# Patient Record
Sex: Female | Born: 1975 | Hispanic: Yes | Marital: Single | Smoking: Never smoker
Health system: Southern US, Community
[De-identification: ages and names within clinical notes are randomized; demographics above are authoritative.]

## PROBLEM LIST (undated history)

## (undated) ENCOUNTER — Inpatient Hospital Stay (HOSPITAL_COMMUNITY): Payer: Self-pay

## (undated) DIAGNOSIS — Z789 Other specified health status: Secondary | ICD-10-CM

## (undated) DIAGNOSIS — T4145XA Adverse effect of unspecified anesthetic, initial encounter: Secondary | ICD-10-CM

## (undated) DIAGNOSIS — T8859XA Other complications of anesthesia, initial encounter: Secondary | ICD-10-CM

---

## 2015-09-19 ENCOUNTER — Encounter (HOSPITAL_BASED_OUTPATIENT_CLINIC_OR_DEPARTMENT_OTHER): Payer: Self-pay | Admitting: Emergency Medicine

## 2015-09-19 DIAGNOSIS — S61411A Laceration without foreign body of right hand, initial encounter: Secondary | ICD-10-CM | POA: Diagnosis not present

## 2015-09-19 DIAGNOSIS — Y9389 Activity, other specified: Secondary | ICD-10-CM | POA: Insufficient documentation

## 2015-09-19 DIAGNOSIS — Y990 Civilian activity done for income or pay: Secondary | ICD-10-CM | POA: Diagnosis not present

## 2015-09-19 DIAGNOSIS — Y9289 Other specified places as the place of occurrence of the external cause: Secondary | ICD-10-CM | POA: Insufficient documentation

## 2015-09-19 DIAGNOSIS — S6991XA Unspecified injury of right wrist, hand and finger(s), initial encounter: Secondary | ICD-10-CM | POA: Diagnosis present

## 2015-09-19 DIAGNOSIS — Z23 Encounter for immunization: Secondary | ICD-10-CM | POA: Diagnosis not present

## 2015-09-19 DIAGNOSIS — W311XXA Contact with metalworking machines, initial encounter: Secondary | ICD-10-CM | POA: Diagnosis not present

## 2015-09-19 NOTE — ED Notes (Signed)
Pt in c/o hand laceration from drill at work, bleeding controlled, NAD.

## 2015-09-20 ENCOUNTER — Encounter (HOSPITAL_BASED_OUTPATIENT_CLINIC_OR_DEPARTMENT_OTHER): Payer: Self-pay | Admitting: Emergency Medicine

## 2015-09-20 ENCOUNTER — Emergency Department (HOSPITAL_BASED_OUTPATIENT_CLINIC_OR_DEPARTMENT_OTHER)
Admission: EM | Admit: 2015-09-20 | Discharge: 2015-09-20 | Disposition: A | Discharge status: Home / Self Care | Payer: Worker's Compensation | Attending: Emergency Medicine | Admitting: Emergency Medicine

## 2015-09-20 DIAGNOSIS — IMO0002 Reserved for concepts with insufficient information to code with codable children: Secondary | ICD-10-CM

## 2015-09-20 MED ORDER — NAPROXEN 250 MG PO TABS
500.0000 mg | ORAL_TABLET | Freq: Once | ORAL | Status: AC
  Administered 2015-09-20: 500 mg via ORAL
  Filled 2015-09-20: qty 2

## 2015-09-20 MED ORDER — TETANUS-DIPHTH-ACELL PERTUSSIS 5-2.5-18.5 LF-MCG/0.5 IM SUSP
0.5000 mL | Freq: Once | INTRAMUSCULAR | Status: AC
  Administered 2015-09-20: 0.5 mL via INTRAMUSCULAR
  Filled 2015-09-20: qty 0.5

## 2015-09-20 NOTE — ED Provider Notes (Signed)
CSN: [id number]     Arrival date & time 09/19/15  2236 History   First MD Initiated Contact with Patient 09/20/15 0055     Chief Complaint  Patient presents with  . Extremity Laceration     (Consider location/radiation/quality/duration/timing/severity/associated sxs/prior Treatment) Patient is a 40 y.o. female presenting with skin laceration. The history is provided by the patient and a friend.  Laceration Location:  Hand Hand laceration location:  Dorsum of R hand Depth:  Through dermis Quality: straight   Bleeding: controlled   Injury mechanism: drill. Pain details:    Quality:  Aching   Severity:  No pain   Progression:  Unchanged Foreign body present:  No foreign bodies Relieved by:  Nothing Worsened by:  Nothing tried Ineffective treatments:  None tried Tetanus status:  Out of date   History reviewed. No pertinent past medical history. Past Surgical History  Procedure Laterality Date  . Cesarean section     History reviewed. No pertinent family history. Social History  Substance Use Topics  . Smoking status: Never Smoker   . Smokeless tobacco: None  . Alcohol Use: No   OB History    No data available     Review of Systems  All other systems reviewed and are negative.     Allergies  Review of patient's allergies indicates no known allergies.  Home Medications   Prior to Admission medications   Not on File   BP 110/71 mmHg  Pulse 73  Temp(Src) 98.2 F (36.8 C) (Oral)  Resp 16  Ht 5\' 1"  (1.549 m)  Wt 150 lb (68.04 kg)  BMI 28.36 kg/m2  SpO2 99% Physical Exam  Constitutional: She is oriented to person, place, and time. She appears well-developed and well-nourished.  HENT:  Head: Normocephalic and atraumatic.  Mouth/Throat: Oropharynx is clear and moist.  Eyes: Pupils are equal, round, and reactive to light.  Cardiovascular: Normal rate and regular rhythm.   Pulmonary/Chest: Effort normal and breath sounds normal. She has no wheezes. She has  no rales.  Abdominal: Soft. Bowel sounds are normal. There is no tenderness. There is no rebound and no guarding.  Musculoskeletal: Normal range of motion.  Neurological: She is alert and oriented to person, place, and time.  Skin: Skin is warm and dry.  Psychiatric: She has a normal mood and affect.    ED Course  .Marland Kitchen.Laceration Repair Date/Time: 09/20/2015 2:01 AM Performed by: Cy BlamerPALUMBO, Shilpa Bushee Authorized by: Cy BlamerPALUMBO, Adis Sturgill Consent: Verbal consent obtained. Patient identity confirmed: arm band Body area: upper extremity Location details: right hand Laceration length: 1 cm Foreign bodies: no foreign bodies Tendon involvement: none Nerve involvement: none Vascular damage: no Patient sedated: no Irrigation solution: saline Amount of cleaning: extensive Debridement: none Degree of undermining: none Skin closure: glue Technique: simple Approximation: loose Approximation difficulty: simple Dressing: 4x4 sterile gauze Patient tolerance: Patient tolerated the procedure well with no immediate complications   (including critical care time) Labs Review Labs Reviewed - No data to display  Imaging Review No results found. I have personally reviewed and evaluated these images and lab results as part of my medical decision-making.   EKG Interpretation None      MDM   Final diagnoses:  None    Follow up with your occ med panel as directed    Darrielle Pflieger, MD 09/20/15 [id number]

## 2017-03-14 ENCOUNTER — Inpatient Hospital Stay (HOSPITAL_COMMUNITY)
Admission: AD | Admit: 2017-03-14 | Discharge: 2017-03-14 | Disposition: A | Discharge status: Home / Self Care | Payer: Self-pay | Source: Ambulatory Visit | Attending: Family Medicine | Admitting: Family Medicine

## 2017-03-14 ENCOUNTER — Inpatient Hospital Stay (HOSPITAL_COMMUNITY): Payer: Self-pay

## 2017-03-14 ENCOUNTER — Encounter (HOSPITAL_COMMUNITY): Payer: Self-pay | Admitting: *Deleted

## 2017-03-14 DIAGNOSIS — B3731 Acute candidiasis of vulva and vagina: Secondary | ICD-10-CM

## 2017-03-14 DIAGNOSIS — Z3A08 8 weeks gestation of pregnancy: Secondary | ICD-10-CM | POA: Insufficient documentation

## 2017-03-14 DIAGNOSIS — O26899 Other specified pregnancy related conditions, unspecified trimester: Secondary | ICD-10-CM

## 2017-03-14 DIAGNOSIS — O26892 Other specified pregnancy related conditions, second trimester: Secondary | ICD-10-CM | POA: Insufficient documentation

## 2017-03-14 DIAGNOSIS — O4691 Antepartum hemorrhage, unspecified, first trimester: Secondary | ICD-10-CM | POA: Insufficient documentation

## 2017-03-14 DIAGNOSIS — O98812 Other maternal infectious and parasitic diseases complicating pregnancy, second trimester: Secondary | ICD-10-CM | POA: Insufficient documentation

## 2017-03-14 DIAGNOSIS — R109 Unspecified abdominal pain: Secondary | ICD-10-CM | POA: Insufficient documentation

## 2017-03-14 DIAGNOSIS — O209 Hemorrhage in early pregnancy, unspecified: Secondary | ICD-10-CM

## 2017-03-14 DIAGNOSIS — Z3491 Encounter for supervision of normal pregnancy, unspecified, first trimester: Secondary | ICD-10-CM

## 2017-03-14 DIAGNOSIS — B373 Candidiasis of vulva and vagina: Secondary | ICD-10-CM | POA: Insufficient documentation

## 2017-03-14 HISTORY — DX: Other specified health status: Z78.9

## 2017-03-14 LAB — URINALYSIS, ROUTINE W REFLEX MICROSCOPIC
BILIRUBIN URINE: NEGATIVE
Glucose, UA: NEGATIVE mg/dL
KETONES UR: NEGATIVE mg/dL
LEUKOCYTES UA: NEGATIVE
NITRITE: NEGATIVE
PH: 5 (ref 5.0–8.0)
Protein, ur: NEGATIVE mg/dL
SPECIFIC GRAVITY, URINE: 1.026 (ref 1.005–1.030)

## 2017-03-14 LAB — POCT PREGNANCY, URINE: Preg Test, Ur: POSITIVE — AB

## 2017-03-14 LAB — WET PREP, GENITAL
Clue Cells Wet Prep HPF POC: NONE SEEN
SPERM: NONE SEEN
TRICH WET PREP: NONE SEEN

## 2017-03-14 LAB — CBC
HEMATOCRIT: 37.7 % (ref 36.0–46.0)
Hemoglobin: 12.8 g/dL (ref 12.0–15.0)
MCH: 31.1 pg (ref 26.0–34.0)
MCHC: 34 g/dL (ref 30.0–36.0)
MCV: 91.5 fL (ref 78.0–100.0)
PLATELETS: 296 10*3/uL (ref 150–400)
RBC: 4.12 MIL/uL (ref 3.87–5.11)
RDW: 13.3 % (ref 11.5–15.5)
WBC: 10.2 10*3/uL (ref 4.0–10.5)

## 2017-03-14 LAB — ABO/RH: ABO/RH(D): A POS

## 2017-03-14 LAB — HCG, QUANTITATIVE, PREGNANCY: hCG, Beta Chain, Quant, S: 126914 m[IU]/mL — ABNORMAL HIGH (ref <5)

## 2017-03-14 MED ORDER — TERCONAZOLE 0.8 % VA CREA: 1.0000 | TOPICAL_CREAM | Freq: Every day | VAGINAL | 0 refills | Status: DC

## 2017-03-14 NOTE — Discharge Instructions (Signed)
Primer trimestre de embarazo °(First Trimester of Pregnancy) °El primer trimestre de embarazo se extiende desde la semana 1 hasta el final de la semana 12 (mes 1 al mes 3). Durante este tiempo, el bebé comenzará a desarrollarse dentro suyo. Entre la semana 6 y la 8, se forman los ojos y el rostro, y los latidos del corazón pueden verse en la ecografía. Al final de las 12 semanas, todos los órganos del bebé están formados. La atención prenatal es toda la asistencia médica que usted recibe antes del nacimiento del bebé. Asegúrese de recibir una buena atención prenatal y de seguir todas las indicaciones del médico. °CUIDADOS EN EL HOGAR °Medicamentos: °· Tome los medicamentos solamente como se lo haya indicado el médico. Algunos medicamentos se pueden tomar durante el embarazo y otros no. °· Tome las vitaminas prenatales como se lo haya indicado el médico. °· Tome el medicamento que la ayuda a defecar (laxante suave) según sea necesario, si el médico lo autoriza. °Dieta °· Ingiera alimentos saludables de manera regular. °· El médico le indicará la cantidad de peso que puede aumentar. °· No coma carne cruda ni quesos sin cocinar. °· Si tiene malestar estomacal (náuseas) o vomita: °? Ingiera 4 o 5 comidas pequeñas por día en lugar de 3 abundantes. °? Intente comer algunas galletitas saladas. °? Beba líquidos entre las comidas, en lugar de hacerlo durante estas. °· Si tiene dificultad para defecar (estreñimiento): °? Consuma alimentos con alto contenido de fibra, como verduras y frutas frescos, y cereales integrales. °? Beba suficiente líquido para mantener el pis (orina) claro o de color amarillo pálido. °Actividad y ejercicios °· Haga ejercicios solamente como se lo haya indicado el médico. Deje de hacer ejercicios si tiene cólicos o dolor en la parte baja del vientre (abdomen) o en la cintura. °· Intente no estar de pie durante mucho tiempo. Mueva las piernas con frecuencia si debe estar de pie en un lugar durante  mucho tiempo. °· Evite levantar pesos excesivos. °· Use zapatos con tacones bajos. Mantenga una buena postura al sentarse y pararse. °· Puede tener relaciones sexuales, a menos que el médico le indique lo contrario. °Alivio del dolor o las molestias °· Use un sostén que le brinde buen soporte si le duelen las mamas. °· Dese baños con agua tibia (baños de asiento) para aliviar el dolor o las molestias a causa de las hemorroides. Use crema antihemorroidal si el médico se lo permite. °· Descanse con las piernas elevadas si tiene calambres o dolor de cintura. °· Use medias de descanso si tiene las venas de las piernas hinchadas y abultadas (venas varicosas). Eleve los pies durante 15 minutos, 3 o 4 veces por día. Limite la cantidad de sal en su dieta. °Cuidados prenatales °· Programe las visitas prenatales para la semana 12 de embarazo. °· Escriba sus preguntas. Llévelas cuando concurra a las visitas prenatales. °· Concurra a todas las visitas prenatales como se lo haya indicado el médico. °Seguridad °· Colóquese el cinturón de seguridad cuando conduzca. °· Haga una lista con los números de teléfono en caso de emergencia, en la cual deben incluirse los números de los familiares, los amigos, el hospital y los departamentos de policía y de bomberos. °Consejos generales °· Pídale al médico que la derive a clases prenatales en su localidad. Debe comenzar a tomar las clases antes de entrar en el mes 6 de embarazo. °· Pida ayuda si necesita asesoramiento o asistencia con la alimentación. El médico puede aconsejarla o indicarle dónde recurrir para recibir ayuda. °· No se dé baños de inmersión en agua caliente, baños   turcos ni saunas. °· No se haga duchas vaginales ni use tampones o toallas higiénicas perfumadas. °· No mantenga las piernas cruzadas durante mucho tiempo. °· Evite el contacto con las bandejas sanitarias de los gatos y la tierra que estos animales usan. °· No fume, no consuma hierbas ni beba alcohol. No tome  fármacos que el médico no haya autorizado. °· No consuma ningún producto que contenga tabaco, lo que incluye cigarrillos, tabaco de mascar o cigarrillos electrónicos. Si necesita ayuda para dejar de fumar, consulte al médico. Puede recibir asesoramiento u otro tipo de apoyo para dejar de fumar. °· Visite al dentista. En su casa, lávese los dientes con un cepillo dental suave. Pásese el hilo dental con suavidad. °SOLICITE AYUDA SI: °· Tiene mareos. °· Tiene cólicos leves o siente presión en la parte baja del vientre. °· Siente un dolor persistente en la zona del vientre. °· Sigue teniendo malestar estomacal, vomita o las heces son líquidas (diarrea). °· Observa una secreción, con mal olor que proviene de la vagina. °· Siente dolor al orinar. °· Tiene el rostro, las manos, las piernas o los tobillos más hinchados (inflamados). ° °SOLICITE AYUDA DE INMEDIATO SI: °· Tiene fiebre. °· Tiene una pérdida de líquido por la vagina. °· Tiene sangrado o pequeñas pérdidas vaginales. °· Tiene cólicos o dolor muy intensos en el vientre. °· Sube o baja de peso rápidamente. °· Vomita sangre. Puede ser similar a la borra del café °· Está en contacto con personas que tienen rubéola, la quinta enfermedad o varicela. °· Siente un dolor de cabeza muy intenso. °· Le falta el aire. °· Sufre cualquier tipo de traumatismo, por ejemplo, debido a una caída o un accidente automovilístico. ° °Esta información no tiene como fin reemplazar el consejo del médico. Asegúrese de hacerle al médico cualquier pregunta que tenga. °Document Released: 09/16/2008 Document Revised: 07/11/2014 Document Reviewed: 04/30/2013 °Elsevier Interactive Patient Education © 2017 Elsevier Inc[employer] ° °

## 2017-03-14 NOTE — MAU Provider Note (Signed)
History     CSN: [id number]  Arrival date and time: 03/14/17 1653  First Provider Initiated Contact with Patient 03/14/17 1927      Chief Complaint  Patient presents with  . Vaginal Bleeding  . Abdominal Pain  . Possible Pregnancy   HPI Carla Bernard is a 41 y.o. G5P4004 at unknown gestation who presents with abdominal pain & vaginal bleeding. Patient has irregular periods & doesn't know last menses. Had positive pregnancy test over the weekend. Reports intermittent lower abdominal cramping. Rates pain 7/10. Has not treated. Noted pink spotting on toilet paper yesterday x 1 episode. Denies n/v/d, dysuria, vaginal discharge, or irritation.   OB History    Gravida Para Term Preterm AB Living   SAB TAB Ectopic Multiple Live Births                  Past Medical History:  Diagnosis Date  . Medical history non-contributory     Past Surgical History:  Procedure Laterality Date  . CESAREAN SECTION     C/S x 4    History reviewed. No pertinent family history.  Social History  Substance Use Topics  . Smoking status: Never Smoker  . Smokeless tobacco: Never Used  . Alcohol use No    Allergies: No Known Allergies  No prescriptions prior to admission.    Review of Systems  Constitutional: Negative.   Gastrointestinal: Positive for abdominal pain. Negative for constipation, diarrhea, nausea and vomiting.  Genitourinary: Positive for vaginal bleeding. Negative for dysuria and vaginal discharge.   Physical Exam   Blood pressure 107/62, pulse 66, temperature 98.3 F (36.8 C), temperature source Oral, resp. rate 16, weight 153 lb 12 oz (69.7 kg), SpO2 100 %.  Physical Exam  Nursing note and vitals reviewed. Constitutional: She is oriented to person, place, and time. She appears well-developed and well-nourished. No distress.  HENT:  Head: Normocephalic and atraumatic.  Eyes: Conjunctivae are normal. Right eye exhibits no discharge. Left eye exhibits no  discharge. No scleral icterus.  Neck: Normal range of motion.  Cardiovascular: Normal rate, regular rhythm and normal heart sounds.   No murmur heard. Respiratory: Effort normal and breath sounds normal. No respiratory distress. She has no wheezes.  GI: Soft. Bowel sounds are normal. She exhibits no distension. There is no tenderness. There is no rebound and no guarding.  Genitourinary: Uterus is enlarged. Cervix exhibits no motion tenderness and no friability. No bleeding in the vagina. Vaginal discharge (small amount of thick white discharge) found.  Genitourinary Comments: Cervix closed  Neurological: She is alert and oriented to person, place, and time.  Skin: Skin is warm and dry. She is not diaphoretic.  Psychiatric: She has a normal mood and affect. Her behavior is normal. Judgment and thought content normal.    MAU Course  Procedures Results for orders placed or performed during the hospital encounter of 03/14/17 (from the past 48 hour(s))  Urinalysis, Routine w reflex microscopic     Status: Abnormal   Collection Time: 03/14/17  6:15 PM  Result Value Ref Range   Color, Urine YELLOW YELLOW   APPearance HAZY (A) CLEAR   Specific Gravity, Urine 1.026 1.005 - 1.030   pH 5.0 5.0 - 8.0   Glucose, UA NEGATIVE NEGATIVE mg/dL   Hgb urine dipstick MODERATE (A) NEGATIVE   Bilirubin Urine NEGATIVE NEGATIVE   Ketones, ur NEGATIVE NEGATIVE mg/dL   Protein, ur NEGATIVE NEGATIVE mg/dL  Nitrite NEGATIVE NEGATIVE   Leukocytes, UA NEGATIVE NEGATIVE   RBC / HPF 0-5 0 - 5 RBC/hpf   WBC, UA 0-5 0 - 5 WBC/hpf   Bacteria, UA RARE (A) NONE SEEN   Squamous Epithelial / LPF 0-5 (A) NONE SEEN   Mucus PRESENT   Pregnancy, urine POC     Status: Abnormal   Collection Time: 03/14/17  6:53 PM  Result Value Ref Range   Preg Test, Ur POSITIVE (A) NEGATIVE    Comment:        THE SENSITIVITY OF THIS METHODOLOGY IS >24 mIU/mL   CBC     Status: None   Collection Time: 03/14/17  7:32 PM  Result Value  Ref Range   WBC 10.2 4.0 - 10.5 K/uL   RBC 4.12 3.87 - 5.11 MIL/uL   Hemoglobin 12.8 12.0 - 15.0 g/dL   HCT 24.437.7 01.036.0 - 27.246.0 %   MCV 91.5 78.0 - 100.0 fL   MCH 31.1 26.0 - 34.0 pg   MCHC 34.0 30.0 - 36.0 g/dL   RDW 53.613.3 64.411.5 - 03.415.5 %   Platelets 296 150 - 400 K/uL  ABO/Rh     Status: None   Collection Time: 03/14/17  7:32 PM  Result Value Ref Range   ABO/RH(D) A POS   hCG, quantitative, pregnancy     Status: Abnormal   Collection Time: 03/14/17  7:32 PM  Result Value Ref Range   hCG, Beta Chain, Quant, S 126,914 (H) <5 mIU/mL    Comment:          GEST. AGE      CONC.  (mIU/mL)   <=1 WEEK        5 - 50     2 WEEKS       50 - 500     3 WEEKS       100 - 10,000     4 WEEKS     1,000 - 30,000     5 WEEKS     3,500 - 115,000   6-8 WEEKS     12,000 - 270,000    12 WEEKS     15,000 - 220,000        FEMALE AND NON-PREGNANT FEMALE:     LESS THAN 5 mIU/mL   Wet prep, genital     Status: Abnormal   Collection Time: 03/14/17  7:40 PM  Result Value Ref Range   Yeast Wet Prep HPF POC PRESENT (A) NONE SEEN   Trich, Wet Prep NONE SEEN NONE SEEN   Clue Cells Wet Prep HPF POC NONE SEEN NONE SEEN   WBC, Wet Prep HPF POC MANY (A) NONE SEEN    Comment: MANY BACTERIA SEEN   Sperm NONE SEEN    [place] Ob Comp Less 14 Wks  Result Date: 03/14/2017 CLINICAL DATA:  Initial evaluation for vaginal bleeding in first trimester pregnancy. EXAM: OBSTETRIC <14 WK US AND TRANSVAGINAL OB US TECHNIQUE: Both transabdominal and transvaginal ultrasound examinations were performed for complete evaluation of the gestation as well as the maternal uterus, adnexal regions, and pelvic cul-de-sac. Transvaginal technique was performed to assess early pregnancy. COMPARISON:  None. FINDINGS: Intrauterine gestational sac: Single Yolk sac:  Present Embryo:  Present Cardiac Activity: Present Heart Rate: 171  bpm CRL:  19.6  mm   8 w   3 d                  US EDC: 10/21/2017 Subchorionic hemorrhage: Small subchorionic hemorrhage  without associated mass effect. Maternal uterus/adnexae: Small corpus luteal cyst noted within the right ovary. Right ovary otherwise unremarkable. Left ovary normal. No adnexal mass. No free fluid within the pelvis. IMPRESSION: 1. Single viable IUP as above. Estimated gestational age 589 weeks and 3 days by sonography. 2. Small subchorionic hemorrhage without associated mass effect. 3. No other acute maternal uterine or adnexal abnormality identified. Electronically Signed   By: Rise Mu M.D.   On: 03/14/2017 20:53   US Ob Transvaginal  Result Date: 03/14/2017 CLINICAL DATA:  Initial evaluation for vaginal bleeding in first trimester pregnancy. EXAM: OBSTETRIC <14 WK [place] AND TRANSVAGINAL OB US TECHNIQUE: Both transabdominal and transvaginal ultrasound examinations were performed for complete evaluation of the gestation as well as the maternal uterus, adnexal regions, and pelvic cul-de-sac. Transvaginal technique was performed to assess early pregnancy. COMPARISON:  None. FINDINGS: Intrauterine gestational sac: Single Yolk sac:  Present Embryo:  Present Cardiac Activity: Present Heart Rate: 171  bpm CRL:  19.6  mm   8 w   3 d                  [place] EDC: 10/21/2017 Subchorionic hemorrhage: Small subchorionic hemorrhage without associated mass effect. Maternal uterus/adnexae: Small corpus luteal cyst noted within the right ovary. Right ovary otherwise unremarkable. Left ovary normal. No adnexal mass. No free fluid within the pelvis. IMPRESSION: 1. Single viable IUP as above. Estimated gestational age 589 weeks and 3 days by sonography. 2. Small subchorionic hemorrhage without associated mass effect. 3. No other acute maternal uterine or adnexal abnormality identified. Electronically Signed   By: Rise Mu M.D.   On: 03/14/2017 20:53    MDM +UPT UA, wet prep, GC/chlamydia, CBC, ABO/Rh, quant hCG, HIV, and [place] today to rule out ectopic pregnancy A positive Ultrasound shows SIUP measuring [redacted]w[redacted]d  with cardiac activity Will tx for yeast, + on wet prep   Assessment and Plan  A: 1. Normal IUP (intrauterine pregnancy) on prenatal ultrasound, first trimester   2. Vaginal bleeding in pregnancy, first trimester   3. Abdominal pain affecting pregnancy   4. Vaginal yeast infection    P: Discharge home Rx terazol Discussed reasons to return to MAU Start prenatal care GC/CT pending   Judeth Horn 03/14/2017, 7:27 PM

## 2017-03-14 NOTE — MAU Note (Signed)
+  HPT x2 last wk. Having pain in lower abd.  Bled yesterday and today.

## 2017-03-15 LAB — HIV ANTIBODY (ROUTINE TESTING W REFLEX): HIV Screen 4th Generation wRfx: NONREACTIVE

## 2017-03-15 LAB — GC/CHLAMYDIA PROBE AMP (~~LOC~~) NOT AT ARMC
Chlamydia: NEGATIVE
Neisseria Gonorrhea: NEGATIVE

## 2017-04-17 LAB — OB RESULTS CONSOLE HEPATITIS B SURFACE ANTIGEN: HEP B S AG: NEGATIVE

## 2017-04-17 LAB — OB RESULTS CONSOLE HIV ANTIBODY (ROUTINE TESTING): HIV: NONREACTIVE

## 2017-04-17 LAB — OB RESULTS CONSOLE ABO/RH: RH Type: POSITIVE

## 2017-04-17 LAB — OB RESULTS CONSOLE ANTIBODY SCREEN: Antibody Screen: NEGATIVE

## 2017-04-17 LAB — OB RESULTS CONSOLE GC/CHLAMYDIA
CHLAMYDIA, DNA PROBE: NEGATIVE
GC PROBE AMP, GENITAL: NEGATIVE

## 2017-04-17 LAB — OB RESULTS CONSOLE RPR: RPR: NONREACTIVE

## 2017-04-17 LAB — OB RESULTS CONSOLE RUBELLA ANTIBODY, IGM: Rubella: IMMUNE

## 2017-08-08 ENCOUNTER — Encounter (HOSPITAL_COMMUNITY): Payer: Self-pay

## 2017-08-17 ENCOUNTER — Other Ambulatory Visit: Payer: Self-pay | Admitting: Obstetrics & Gynecology

## 2017-09-27 LAB — OB RESULTS CONSOLE GBS: GBS: NEGATIVE

## 2017-10-04 ENCOUNTER — Encounter (HOSPITAL_COMMUNITY): Payer: Self-pay

## 2017-10-04 NOTE — Pre-Procedure Instructions (Signed)
[id number] interpreter number  

## 2017-10-12 NOTE — Patient Instructions (Addendum)
Instrucciones Para Antes de la Ciruga   Su ciruga est programada para 10/14/2017  (your procedure is scheduled on) Entre por la entrada principal del [facility]  a las [address] de la [place] -(enter through the main entrance at [facility] at  AM    [employer] el telfono, [place] el [phone] para informarnos de su llegada. (pick up phone, dial [phone] on arrival)     Por favor llame al [phone] si tiene algn problema en la maana de la ciruga (please call this number if you have any problems the morning of surgery.)                  Recuerde: (Remember)  No coma alimentos. (Do not eat food (After Midnight) Desps de medianoche)    No tome lquidos claros. (Do not drink clear liquids (After Midnight) Desps de medianoche)    No use joyas, maquillaje de ojos, lpiz labial, crema para el cuerpo o esmalte de uas oscuro. (Do not wear jewelry, eye makeup, lipstick, body lotion, or dark fingernail polish). Puede usar desodorante (you may wear deodorant)    No se afeite 48 horas de su ciruga. (Do not shave 48 hours before your surgery)    No traiga objetos de valor al hospital.  [place] no se hace responsable de ninguna pertenencia, ni objetos de valor que haya trado al hospital. (Do not bring valuable to the hospital.  [place] is not responsible for any belongings or valuables brought to the hospital)   [facility] estas medicinas en la maana de la ciruga con un SORBITO de agua nada (take these meds the morning of surgery with a SIP of water)     Durante la ciruga no se pueden usar lentes de contacto, dentaduras o puentes. (Contacts, dentures or bridgework cannot be worn in surgery).   Si va a ser ingresado despus de la ciruga, deje la [employer] Corporationmaleta en el carro hasta que se le haya asignado una habitacin. (If you are to be admitted after surgery, leave suitcase in car until your room has been assigned.)   A los pacientes que se les d de alta el  mismo da no se les permitir manejar a casa.  (Patients discharged on the day of surgery will not be allowed to drive home)    [place] y [place] de telfono del Programmer, multimediaconductor na. (Name and telephone number of your driver)   Instrucciones especiales N/A (Special Instructions)   Por favor, lea las hojas informativas que le entregaron. (Please read over the following fact sheets that you were given) Surgical Site Infection Prevention

## 2017-10-13 ENCOUNTER — Encounter (HOSPITAL_COMMUNITY)
Admission: RE | Admit: 2017-10-13 | Discharge: 2017-10-13 | Disposition: A | Discharge status: Home / Self Care | Payer: Medicaid Other | Source: Ambulatory Visit | Attending: Obstetrics & Gynecology | Admitting: Obstetrics & Gynecology

## 2017-10-13 HISTORY — DX: Adverse effect of unspecified anesthetic, initial encounter: T41.45XA

## 2017-10-13 HISTORY — DX: Other complications of anesthesia, initial encounter: T88.59XA

## 2017-10-13 LAB — CBC
HCT: 38.7 % (ref 36.0–46.0)
HEMOGLOBIN: 13 g/dL (ref 12.0–15.0)
MCH: 30 pg (ref 26.0–34.0)
MCHC: 33.6 g/dL (ref 30.0–36.0)
MCV: 89.4 fL (ref 78.0–100.0)
PLATELETS: 257 10*3/uL (ref 150–400)
RBC: 4.33 MIL/uL (ref 3.87–5.11)
RDW: 13.8 % (ref 11.5–15.5)
WBC: 9.6 10*3/uL (ref 4.0–10.5)

## 2017-10-13 LAB — TYPE AND SCREEN
ABO/RH(D): A POS
ANTIBODY SCREEN: NEGATIVE

## 2017-10-13 LAB — ABO/RH: ABO/RH(D): A POS

## 2017-10-14 ENCOUNTER — Other Ambulatory Visit: Payer: Self-pay

## 2017-10-14 ENCOUNTER — Inpatient Hospital Stay (HOSPITAL_COMMUNITY): Payer: Medicaid Other | Admitting: Anesthesiology

## 2017-10-14 ENCOUNTER — Encounter (HOSPITAL_COMMUNITY)
Admission: RE | Disposition: A | Discharge status: Home / Self Care | Payer: Self-pay | Source: Ambulatory Visit | Attending: Obstetrics & Gynecology

## 2017-10-14 ENCOUNTER — Inpatient Hospital Stay (HOSPITAL_COMMUNITY)
Admission: RE | Admit: 2017-10-14 | Discharge: 2017-10-16 | DRG: 785 | Disposition: A | Discharge status: Home / Self Care | Payer: Medicaid Other | Source: Ambulatory Visit | Attending: Obstetrics & Gynecology | Admitting: Obstetrics & Gynecology

## 2017-10-14 ENCOUNTER — Encounter (HOSPITAL_COMMUNITY): Payer: Self-pay | Admitting: *Deleted

## 2017-10-14 DIAGNOSIS — Z98891 History of uterine scar from previous surgery: Secondary | ICD-10-CM

## 2017-10-14 DIAGNOSIS — Z3A39 39 weeks gestation of pregnancy: Secondary | ICD-10-CM | POA: Diagnosis not present

## 2017-10-14 DIAGNOSIS — Z302 Encounter for sterilization: Secondary | ICD-10-CM

## 2017-10-14 DIAGNOSIS — O34211 Maternal care for low transverse scar from previous cesarean delivery: Secondary | ICD-10-CM | POA: Diagnosis present

## 2017-10-14 DIAGNOSIS — O34219 Maternal care for unspecified type scar from previous cesarean delivery: Secondary | ICD-10-CM | POA: Diagnosis present

## 2017-10-14 LAB — RPR: RPR: NONREACTIVE

## 2017-10-14 SURGERY — Surgical Case
Anesthesia: Spinal | Site: Abdomen | Wound class: Clean Contaminated

## 2017-10-14 MED ORDER — METOCLOPRAMIDE HCL 5 MG/ML IJ SOLN
INTRAMUSCULAR | Status: DC | PRN
  Administered 2017-10-14 (×2): 5 mg via INTRAVENOUS

## 2017-10-14 MED ORDER — DIPHENHYDRAMINE HCL 25 MG PO CAPS: 25.0000 mg | ORAL_CAPSULE | Freq: Four times a day (QID) | ORAL | Status: DC | PRN

## 2017-10-14 MED ORDER — DIPHENHYDRAMINE HCL 50 MG/ML IJ SOLN
INTRAMUSCULAR | Status: AC
  Filled 2017-10-14: qty 1

## 2017-10-14 MED ORDER — PHENYLEPHRINE 40 MCG/ML (10ML) SYRINGE FOR IV PUSH (FOR BLOOD PRESSURE SUPPORT)
PREFILLED_SYRINGE | INTRAVENOUS | Status: AC
  Filled 2017-10-14: qty 10

## 2017-10-14 MED ORDER — PRENATAL MULTIVITAMIN CH
1.0000 | ORAL_TABLET | Freq: Every day | ORAL | Status: DC
  Administered 2017-10-15 – 2017-10-16 (×2): 1 via ORAL
  Filled 2017-10-14 (×2): qty 1

## 2017-10-14 MED ORDER — LACTATED RINGERS IV SOLN
INTRAVENOUS | Status: DC | PRN
  Administered 2017-10-14: 12:00:00 via INTRAVENOUS

## 2017-10-14 MED ORDER — ACETAMINOPHEN 325 MG PO TABS
650.0000 mg | ORAL_TABLET | ORAL | Status: DC | PRN
  Administered 2017-10-14 – 2017-10-16 (×4): 650 mg via ORAL
  Filled 2017-10-14 (×5): qty 2

## 2017-10-14 MED ORDER — CEFAZOLIN SODIUM-DEXTROSE 2-4 GM/100ML-% IV SOLN
INTRAVENOUS | Status: AC
  Filled 2017-10-14: qty 100

## 2017-10-14 MED ORDER — ACETAMINOPHEN 160 MG/5ML PO SOLN: 325.0000 mg | ORAL | Status: DC | PRN

## 2017-10-14 MED ORDER — MORPHINE SULFATE (PF) 0.5 MG/ML IJ SOLN
INTRAMUSCULAR | Status: DC | PRN
  Administered 2017-10-14: .2 mg via EPIDURAL

## 2017-10-14 MED ORDER — NALBUPHINE HCL 10 MG/ML IJ SOLN
5.0000 mg | INTRAMUSCULAR | Status: DC | PRN
  Administered 2017-10-14: 5 mg via SUBCUTANEOUS
  Filled 2017-10-14: qty 1

## 2017-10-14 MED ORDER — TETANUS-DIPHTH-ACELL PERTUSSIS 5-2.5-18.5 LF-MCG/0.5 IM SUSP: 0.5000 mL | Freq: Once | INTRAMUSCULAR | Status: DC

## 2017-10-14 MED ORDER — NALOXONE HCL 0.4 MG/ML IJ SOLN: 0.4000 mg | INTRAMUSCULAR | Status: DC | PRN

## 2017-10-14 MED ORDER — SIMETHICONE 80 MG PO CHEW
80.0000 mg | CHEWABLE_TABLET | Freq: Three times a day (TID) | ORAL | Status: DC
  Administered 2017-10-14 – 2017-10-16 (×4): 80 mg via ORAL
  Filled 2017-10-14 (×5): qty 1

## 2017-10-14 MED ORDER — OXYCODONE HCL 5 MG PO TABS
10.0000 mg | ORAL_TABLET | ORAL | Status: DC | PRN
Start: 2017-10-14 — End: 2017-10-16

## 2017-10-14 MED ORDER — BUPIVACAINE HCL (PF) 0.5 % IJ SOLN
INTRAMUSCULAR | Status: DC | PRN
  Administered 2017-10-14: 30 mL

## 2017-10-14 MED ORDER — MORPHINE SULFATE (PF) 0.5 MG/ML IJ SOLN
INTRAMUSCULAR | Status: AC
  Filled 2017-10-14: qty 10

## 2017-10-14 MED ORDER — EPHEDRINE SULFATE 50 MG/ML IJ SOLN
INTRAMUSCULAR | Status: DC | PRN
  Administered 2017-10-14: 5 mg via INTRAVENOUS

## 2017-10-14 MED ORDER — CEFAZOLIN SODIUM-DEXTROSE 2-4 GM/100ML-% IV SOLN
2.0000 g | INTRAVENOUS | Status: AC
  Administered 2017-10-14: 2 g via INTRAVENOUS

## 2017-10-14 MED ORDER — METOCLOPRAMIDE HCL 5 MG/ML IJ SOLN
INTRAMUSCULAR | Status: AC
  Filled 2017-10-14: qty 2

## 2017-10-14 MED ORDER — EPHEDRINE 5 MG/ML INJ
INTRAVENOUS | Status: AC
  Filled 2017-10-14: qty 10

## 2017-10-14 MED ORDER — SODIUM CHLORIDE 0.9% FLUSH
3.0000 mL | INTRAVENOUS | Status: DC | PRN
Start: 2017-10-14 — End: 2017-10-16

## 2017-10-14 MED ORDER — PHENYLEPHRINE 8 MG IN D5W 100 ML (0.08MG/ML) PREMIX OPTIME
INJECTION | INTRAVENOUS | Status: AC
  Filled 2017-10-14: qty 100

## 2017-10-14 MED ORDER — ONDANSETRON HCL 4 MG/2ML IJ SOLN
INTRAMUSCULAR | Status: DC | PRN
  Administered 2017-10-14: 4 mg via INTRAVENOUS

## 2017-10-14 MED ORDER — ONDANSETRON HCL 4 MG/2ML IJ SOLN: 4.0000 mg | Freq: Once | INTRAMUSCULAR | Status: DC | PRN

## 2017-10-14 MED ORDER — KETOROLAC TROMETHAMINE 30 MG/ML IJ SOLN: 30.0000 mg | Freq: Four times a day (QID) | INTRAMUSCULAR | Status: AC | PRN

## 2017-10-14 MED ORDER — MEPERIDINE HCL 25 MG/ML IJ SOLN: 6.2500 mg | INTRAMUSCULAR | Status: DC | PRN

## 2017-10-14 MED ORDER — OXYCODONE HCL 5 MG PO TABS
5.0000 mg | ORAL_TABLET | ORAL | Status: DC | PRN
  Administered 2017-10-15 – 2017-10-16 (×3): 5 mg via ORAL
  Filled 2017-10-14 (×3): qty 1

## 2017-10-14 MED ORDER — MENTHOL 3 MG MT LOZG: 1.0000 | LOZENGE | OROMUCOSAL | Status: DC | PRN

## 2017-10-14 MED ORDER — SENNOSIDES-DOCUSATE SODIUM 8.6-50 MG PO TABS
2.0000 | ORAL_TABLET | ORAL | Status: DC
  Administered 2017-10-14 – 2017-10-16 (×2): 2 via ORAL
  Filled 2017-10-14 (×2): qty 2

## 2017-10-14 MED ORDER — IBUPROFEN 600 MG PO TABS
600.0000 mg | ORAL_TABLET | Freq: Four times a day (QID) | ORAL | Status: DC
  Administered 2017-10-14 – 2017-10-16 (×8): 600 mg via ORAL
  Filled 2017-10-14 (×9): qty 1

## 2017-10-14 MED ORDER — NALBUPHINE HCL 10 MG/ML IJ SOLN: 5.0000 mg | Freq: Once | INTRAMUSCULAR | Status: DC | PRN

## 2017-10-14 MED ORDER — PHENYLEPHRINE HCL 10 MG/ML IJ SOLN
INTRAMUSCULAR | Status: DC | PRN
  Administered 2017-10-14 (×3): 100 ug via INTRAVENOUS

## 2017-10-14 MED ORDER — ENOXAPARIN SODIUM 40 MG/0.4ML ~~LOC~~ SOLN
40.0000 mg | SUBCUTANEOUS | Status: DC
  Administered 2017-10-15 – 2017-10-16 (×2): 40 mg via SUBCUTANEOUS
  Filled 2017-10-14 (×2): qty 0.4

## 2017-10-14 MED ORDER — SODIUM CHLORIDE 0.9 % IR SOLN
Status: DC | PRN
  Administered 2017-10-14: 1

## 2017-10-14 MED ORDER — OXYTOCIN 10 UNIT/ML IJ SOLN
INTRAVENOUS | Status: DC | PRN
  Administered 2017-10-14: 40 [IU] via INTRAVENOUS

## 2017-10-14 MED ORDER — OXYTOCIN 40 UNITS IN LACTATED RINGERS INFUSION - SIMPLE MED
2.5000 [IU]/h | INTRAVENOUS | Status: AC
  Administered 2017-10-14: 2.5 [IU]/h via INTRAVENOUS
  Filled 2017-10-14: qty 1000

## 2017-10-14 MED ORDER — OXYTOCIN 10 UNIT/ML IJ SOLN
INTRAMUSCULAR | Status: AC
  Filled 2017-10-14: qty 4

## 2017-10-14 MED ORDER — NALBUPHINE HCL 10 MG/ML IJ SOLN
5.0000 mg | INTRAMUSCULAR | Status: DC | PRN
  Administered 2017-10-14: 5 mg via INTRAVENOUS
  Filled 2017-10-14: qty 1

## 2017-10-14 MED ORDER — OXYCODONE HCL 5 MG/5ML PO SOLN: 5.0000 mg | Freq: Once | ORAL | Status: DC | PRN

## 2017-10-14 MED ORDER — SCOPOLAMINE 1 MG/3DAYS TD PT72
1.0000 | MEDICATED_PATCH | Freq: Once | TRANSDERMAL | Status: DC
  Filled 2017-10-14: qty 1

## 2017-10-14 MED ORDER — LACTATED RINGERS IV SOLN
INTRAVENOUS | Status: DC
  Administered 2017-10-14 (×2): via INTRAVENOUS

## 2017-10-14 MED ORDER — COCONUT OIL OIL: 1.0000 "application " | TOPICAL_OIL | Status: DC | PRN

## 2017-10-14 MED ORDER — ACETAMINOPHEN 325 MG PO TABS: 325.0000 mg | ORAL_TABLET | ORAL | Status: DC | PRN

## 2017-10-14 MED ORDER — TRANEXAMIC ACID 1000 MG/10ML IV SOLN
1000.0000 mg | INTRAVENOUS | Status: AC
  Administered 2017-10-14: 1000 mg via INTRAVENOUS
  Filled 2017-10-14: qty 10

## 2017-10-14 MED ORDER — FENTANYL CITRATE (PF) 100 MCG/2ML IJ SOLN
INTRAMUSCULAR | Status: DC | PRN
  Administered 2017-10-14: 25 ug via INTRAVENOUS

## 2017-10-14 MED ORDER — BUPIVACAINE HCL (PF) 0.5 % IJ SOLN
INTRAMUSCULAR | Status: AC
Start: 2017-10-14 — End: 2017-10-14
  Filled 2017-10-14: qty 30

## 2017-10-14 MED ORDER — SIMETHICONE 80 MG PO CHEW
80.0000 mg | CHEWABLE_TABLET | ORAL | Status: DC | PRN
Start: 2017-10-14 — End: 2017-10-16

## 2017-10-14 MED ORDER — FENTANYL CITRATE (PF) 100 MCG/2ML IJ SOLN
INTRAMUSCULAR | Status: AC
  Filled 2017-10-14: qty 2

## 2017-10-14 MED ORDER — FENTANYL CITRATE (PF) 100 MCG/2ML IJ SOLN: 25.0000 ug | INTRAMUSCULAR | Status: DC | PRN

## 2017-10-14 MED ORDER — LACTATED RINGERS IV SOLN
INTRAVENOUS | Status: DC
  Administered 2017-10-14 (×3): via INTRAVENOUS

## 2017-10-14 MED ORDER — ONDANSETRON HCL 4 MG/2ML IJ SOLN: 4.0000 mg | Freq: Three times a day (TID) | INTRAMUSCULAR | Status: DC | PRN

## 2017-10-14 MED ORDER — PHENYLEPHRINE 8 MG IN D5W 100 ML (0.08MG/ML) PREMIX OPTIME
INJECTION | INTRAVENOUS | Status: DC | PRN
  Administered 2017-10-14: 60 ug/min via INTRAVENOUS

## 2017-10-14 MED ORDER — BUPIVACAINE IN DEXTROSE 0.75-8.25 % IT SOLN
INTRATHECAL | Status: DC | PRN
  Administered 2017-10-14: 1 mL via INTRATHECAL

## 2017-10-14 MED ORDER — NALOXONE HCL 4 MG/10ML IJ SOLN
1.0000 ug/kg/h | INTRAVENOUS | Status: DC | PRN
  Filled 2017-10-14: qty 5

## 2017-10-14 MED ORDER — DIPHENHYDRAMINE HCL 50 MG/ML IJ SOLN: 12.5000 mg | INTRAMUSCULAR | Status: DC | PRN

## 2017-10-14 MED ORDER — ZOLPIDEM TARTRATE 5 MG PO TABS: 5.0000 mg | ORAL_TABLET | Freq: Every evening | ORAL | Status: DC | PRN

## 2017-10-14 MED ORDER — WITCH HAZEL-GLYCERIN EX PADS: 1.0000 "application " | MEDICATED_PAD | CUTANEOUS | Status: DC | PRN

## 2017-10-14 MED ORDER — SIMETHICONE 80 MG PO CHEW
80.0000 mg | CHEWABLE_TABLET | ORAL | Status: DC
  Administered 2017-10-14 – 2017-10-16 (×2): 80 mg via ORAL
  Filled 2017-10-14 (×2): qty 1

## 2017-10-14 MED ORDER — DIPHENHYDRAMINE HCL 50 MG/ML IJ SOLN
INTRAMUSCULAR | Status: DC | PRN
  Administered 2017-10-14: 25 mg via INTRAVENOUS

## 2017-10-14 MED ORDER — DIPHENHYDRAMINE HCL 25 MG PO CAPS: 25.0000 mg | ORAL_CAPSULE | ORAL | Status: DC | PRN

## 2017-10-14 MED ORDER — DIBUCAINE 1 % RE OINT: 1.0000 "application " | TOPICAL_OINTMENT | RECTAL | Status: DC | PRN

## 2017-10-14 MED ORDER — OXYCODONE HCL 5 MG PO TABS: 5.0000 mg | ORAL_TABLET | Freq: Once | ORAL | Status: DC | PRN

## 2017-10-14 MED ORDER — ONDANSETRON HCL 4 MG/2ML IJ SOLN
INTRAMUSCULAR | Status: AC
  Filled 2017-10-14: qty 2

## 2017-10-14 SURGICAL SUPPLY — 36 items
BARRIER ADHS 3X4 INTERCEED (GAUZE/BANDAGES/DRESSINGS) IMPLANT
BENZOIN TINCTURE PRP APPL 2/3 (GAUZE/BANDAGES/DRESSINGS) ×4 IMPLANT
CHLORAPREP W/TINT 26ML (MISCELLANEOUS) ×4 IMPLANT
CLAMP CORD UMBIL (MISCELLANEOUS) IMPLANT
CLIP FILSHIE TUBAL LIGA STRL (Clip) ×4 IMPLANT
CLOSURE WOUND 1/2 X4 (GAUZE/BANDAGES/DRESSINGS) ×1
CLOTH BEACON ORANGE TIMEOUT ST (SAFETY) ×4 IMPLANT
DRSG OPSITE POSTOP 4X10 (GAUZE/BANDAGES/DRESSINGS) ×4 IMPLANT
ELECT REM PT RETURN 9FT ADLT (ELECTROSURGICAL) ×4
ELECTRODE REM PT RTRN 9FT ADLT (ELECTROSURGICAL) ×2 IMPLANT
EXTRACTOR VACUUM KIWI (MISCELLANEOUS) IMPLANT
GLOVE BIO SURGEON STRL SZ 6.5 (GLOVE) ×3 IMPLANT
GLOVE BIO SURGEONS STRL SZ 6.5 (GLOVE) ×1
GLOVE BIOGEL PI IND STRL 7.0 (GLOVE) ×4 IMPLANT
GLOVE BIOGEL PI INDICATOR 7.0 (GLOVE) ×4
GOWN STRL REUS W/TWL LRG LVL3 (GOWN DISPOSABLE) ×8 IMPLANT
HEMOSTAT ARISTA ABSORB 3G PWDR (MISCELLANEOUS) ×4 IMPLANT
KIT ABG SYR 3ML LUER SLIP (SYRINGE) IMPLANT
NEEDLE HYPO 22GX1.5 SAFETY (NEEDLE) IMPLANT
NEEDLE HYPO 25X5/8 SAFETYGLIDE (NEEDLE) IMPLANT
NS IRRIG 1000ML POUR BTL (IV SOLUTION) ×4 IMPLANT
PACK C SECTION WH (CUSTOM PROCEDURE TRAY) ×4 IMPLANT
PAD ABD 8X10 STRL (GAUZE/BANDAGES/DRESSINGS) ×8 IMPLANT
PAD OB MATERNITY 4.3X12.25 (PERSONAL CARE ITEMS) ×4 IMPLANT
PENCIL SMOKE EVAC W/HOLSTER (ELECTROSURGICAL) ×4 IMPLANT
RETRACTOR WND ALEXIS 25 LRG (MISCELLANEOUS) IMPLANT
RTRCTR WOUND ALEXIS 25CM LRG (MISCELLANEOUS)
STRIP CLOSURE SKIN 1/2X4 (GAUZE/BANDAGES/DRESSINGS) ×3 IMPLANT
SUT VIC AB 0 CT1 36 (SUTURE) ×24 IMPLANT
SUT VIC AB 2-0 CT1 27 (SUTURE) ×2
SUT VIC AB 2-0 CT1 TAPERPNT 27 (SUTURE) ×2 IMPLANT
SUT VIC AB 4-0 PS2 27 (SUTURE) ×4 IMPLANT
SYR CONTROL 10ML LL (SYRINGE) IMPLANT
TAPE CLOTH SURG 4X10 WHT LF (GAUZE/BANDAGES/DRESSINGS) ×4 IMPLANT
TOWEL OR 17X24 6PK STRL BLUE (TOWEL DISPOSABLE) ×4 IMPLANT
TRAY FOLEY W/BAG SLVR 14FR LF (SET/KITS/TRAYS/PACK) IMPLANT

## 2017-10-14 NOTE — Anesthesia Procedure Notes (Signed)
Spinal  Patient location during procedure: OR Start time: 10/14/2017 11:30 AM End time: 10/14/2017 11:32 AM Staffing Anesthesiologist: Bethena Midgetddono, Cassandra Mcmanaman, MD Preanesthetic Checklist Completed: patient identified, site marked, surgical consent, pre-op evaluation, timeout performed, IV checked, risks and benefits discussed and monitors and equipment checked Spinal Block Patient position: sitting Prep: DuraPrep Patient monitoring: heart rate, cardiac monitor, continuous pulse ox and blood pressure Approach: midline Location: L3-4 Injection technique: single-shot Needle Needle type: Sprotte  Needle gauge: 24 G Needle length: 9 cm Assessment Sensory level: T4

## 2017-10-14 NOTE — Addendum Note (Signed)
Addendum  created 10/14/17 1717 by Bethena Midgetddono, Nidia Grogan, MD   Child order released for a procedure order, Intraprocedure Blocks edited, Sign clinical note

## 2017-10-14 NOTE — Op Note (Signed)
Operative Report  PATIENT: Carla Bernard  PROCEDURE DATE: 10/14/2017  PREOPERATIVE DIAGNOSES: Intrauterine pregnancy at [redacted]w[redacted]d weeks gestation; History to prior Cesarean Section x 4;  Undesired fertility  POSTOPERATIVE DIAGNOSES: The same. Omental adhesions.   PROCEDURE: Repeat Low Transverse Cesarean Section; Bilateral Tubal Sterilization using Filshie clips  SURGEON:   Surgeon(s) and Role:    * Adam Phenix, MD - Primary    * Yarden Hillis, Kandra Nicolas, MD - OB Fellow   INDICATIONS: Carla Bernard is a 42 y.o. G5P4004 at [redacted]w[redacted]d here for cesarean section secondary to the indications listed under preoperative diagnoses; please see preoperative note for further details.  The risks of cesarean section were discussed with the patient including but were not limited to: bleeding which may require transfusion or reoperation; infection which may require antibiotics; injury to bowel, bladder, ureters or other surrounding organs; injury to the fetus; need for additional procedures including hysterectomy in the event of a life-threatening hemorrhage; placental abnormalities wth subsequent pregnancies, incisional problems, thromboembolic phenomenon and other postoperative/anesthesia complications.   The patient concurred with the proposed plan, giving informed written consent for the procedure.    FINDINGS: Omental adhesions to anterior uterus. Viable female infant in cephalic presentation.  Apgars 9 and 9.  Meconium-stained amniotic fluid.  Intact placenta, three vessel cord.  Normal uterus, fallopian tubes and ovaries bilaterally.   ANESTHESIA: Spinal INTRAVENOUS FLUIDS: 3,000 ESTIMATED BLOOD LOSS: 720 mL URINE OUTPUT:  100 ml SPECIMENS: Placenta sent to L&D COMPLICATIONS: None immediate  PROCEDURE IN DETAIL:  The patient preoperatively received intravenous antibiotics and had sequential compression devices applied to her lower extremities.  She was then taken to the operating room where spinal  anesthesia was administered and was found to be adequate. She was then placed in a dorsal supine position with a leftward tilt, and prepped and draped in a sterile manner.  A foley catheter was placed into her bladder and attached to constant gravity.    After an adequate timeout was performed, a Pfannenstiel skin incision was made with scalpel over her preexisting scar and carried through to the underlying layer of fascia. The fascia was incised in the midline, and this incision was extended bilaterally using the Mayo scissors.  Kocher clamps were applied to the superior aspect of the fascial incision and the underlying rectus muscles were dissected off bluntly and with Mayo scissors.  A similar process was carried out on the inferior aspect of the fascial incision. The rectus muscles were separated in the midline bluntly and the peritoneum was entered bluntly. Omental adhesions to the uterus serosa were encountered, and these were dissected off with electrocautery. Alexis retractor was then placed, and attention was turned to the lower uterine segment. She was found to have a very thin lower uterine segment, a low transverse hysterotomy was made with a scalpel just above it, and extended bilaterally bluntly.  The infant was successfully delivered, the cord was clamped and cut after one minute, and the infant was handed over to the awaiting neonatology team. Uterine massage was then administered, and the placenta delivered intact with a three-vessel cord. The uterus was then cleared of clots and debris.  The hysterotomy was closed with 0 Vicryl in a running locked fashion. An additional running locked stitch was placed to help with hemostasis.  Attention was then turned to the fallopian tubes, and Filshie clips were placed about 3 cm from the cornua, with care given to incorporate the underlying mesosalpinx on both sides, allowing for bilateral tubal  sterilization. The pelvis was cleared of all clot and debris.  Uterine incision was inspected for hemostasis,  Arista was placed around uterine incision to help with hemostasis.  The peritoneum was closed with a 2-0 Vicryl running stitch. The fascia was then closed using 0 Vicryl. The subcutaneous layer was irrigated, then reapproximated with 2-0 plain gut interrupted stitches, and 30 ml of 0.5% Marcaine was injected subcutaneously around the incision.  The skin was closed with a 4-0 Vicryl subcuticular stitch.   The patient tolerated the procedure well. Sponge, lap, instrument and needle counts were correct x 3.  She was taken to the recovery room in stable condition.    Disposition: PACU - hemodynamically stable.   Maternal Condition: stable    Signed: Frederik PearJulie P Kyndahl Jablon, MD OB Fellow 10/14/2017 1:08 PM

## 2017-10-14 NOTE — Transfer of Care (Signed)
Immediate Anesthesia Transfer of Care Note  Patient: Carla Bernard  Procedure(s) Performed: CESAREAN SECTION WITH BILATERAL TUBAL LIGATION (N/A Abdomen)  Patient Location: PACU  Anesthesia Type:Spinal  Level of Consciousness: awake, alert  and oriented  Airway & Oxygen Therapy: Patient Spontanous Breathing  Post-op Assessment: Report given to RN and Post -op Vital signs reviewed and stable  Post vital signs: Reviewed and stable  Last Vitals:  Vitals Value Taken Time  BP    Temp    Pulse    Resp    SpO2      Last Pain:  Vitals:   10/14/17 1017  TempSrc: Oral         Complications: No apparent anesthesia complications

## 2017-10-14 NOTE — Anesthesia Postprocedure Evaluation (Signed)
Anesthesia Post Note  Patient: Carla GristClaudia Bernard  Procedure(s) Performed: CESAREAN SECTION WITH BILATERAL TUBAL LIGATION (N/A Abdomen)     Patient location during evaluation: PACU Anesthesia Type: Spinal Level of consciousness: oriented and awake and alert Pain management: pain level controlled Vital Signs Assessment: post-procedure vital signs reviewed and stable Respiratory status: spontaneous breathing, respiratory function stable and patient connected to nasal cannula oxygen Cardiovascular status: blood pressure returned to baseline and stable Postop Assessment: no headache, no backache and no apparent nausea or vomiting Anesthetic complications: no    Last Vitals:  Vitals:   10/14/17 1415 10/14/17 1419  BP: 93/60   Pulse: 87 72  Resp: (!) 26   Temp: 36.9 C   SpO2: 100% 100%    Last Pain:  Vitals:   10/14/17 1415  TempSrc: Oral   Pain Goal:                 [name]

## 2017-10-14 NOTE — H&P (Signed)
Carla Bernard is a 41 y.o. female G5P4004, .[redacted]w[redacted]d  presenting for fifth cesarean section and bilateral tubal ligation. OB History    Gravida  5   Para  4   Term  4   Preterm      AB      Living  4     SAB      TAB      Ectopic      Multiple      Live Births  4          Past Medical History:  Diagnosis Date  . Complication of anesthesia   . Medical history non-contributory    Past Surgical History:  Procedure Laterality Date  . CESAREAN SECTION     C/S x 4   Family History: family history is not on file. Social History:  reports that she has never smoked. She has never used smokeless tobacco. She reports that she does not drink alcohol or use drugs.     Maternal Diabetes: No Genetic Screening: Declined Maternal Ultrasounds/Referrals: Normal Fetal Ultrasounds or other Referrals:  None Maternal Substance Abuse:  No Significant Maternal Medications:  None Significant Maternal Lab Results:  None Other Comments:  None  Review of Systems  Constitutional: Negative.   Respiratory: Negative.   Cardiovascular: Negative.   Genitourinary: Negative.    Maternal Medical History:  Reason for admission: 4 prior cesarean sections  Fetal activity: Perceived fetal activity is normal.   Last perceived fetal movement was within the past hour.    Prenatal complications: no prenatal complications Prenatal Complications - Diabetes: none.      Blood pressure 103/61, pulse 88, temperature 98 F (36.7 C), temperature source Oral, resp. rate 16, height 4\' 11"  (1.499 m), weight 184 lb 8 oz (83.7 kg). Maternal Exam:  Abdomen: Surgical scars: low transverse and low vertical midline.   Fundal height is 37 cm.   Estimated fetal weight is 6.5 lb.    Introitus: not evaluated.   Cervix: not evaluated.   Physical Exam  Vitals reviewed. Constitutional: She is oriented to person, place, and time. She appears well-developed. No distress.  Cardiovascular: Normal rate.   Respiratory: Effort normal. No respiratory distress.  GI:  gravid  Neurological: She is alert and oriented to person, place, and time.  Psychiatric: She has a normal mood and affect. Her behavior is normal.    Prenatal labs: ABO, Rh: --/--/A POS, A POS Performed at [facility], [address], [place], [place] [address]  [phone] 1030) Antibody: NEG (04/12 1030) Rubella: Immune (10/15 0000) RPR: Non Reactive (04/12 1030)  HBsAg: Negative (10/15 0000)  HIV: Non-reactive (10/15 0000)  GBS: Negative (03/27 0000)   Assessment/Plan: Admitted at 39 weeks by 8 week [place] for 5th repeat cesarean section and desires sterilization. The risks of cesarean section discussed with the patient included but were not limited to: bleeding which may require transfusion or reoperation; infection which may require antibiotics; injury to bowel, bladder, ureters or other surrounding organs; injury to the fetus; need for additional procedures including hysterectomy in the event of a life-threatening hemorrhage; placental abnormalities wth subsequent pregnancies, incisional problems, thromboembolic phenomenon and other postoperative/anesthesia complications. The patient concurred with the proposed plan, giving informed written consent for the procedure.  42 y.o. [name] with undesired fertility desires permanent sterilization. Risks and benefits of tubal sterilization procedure was discussed with the patient including permanence of method, bleeding, infection, injury to surrounding organs, anesthesia and need for additional procedures. Will use Filshie  clips. Risk failure of 0.5-1% with increased risk of ectopic gestation if pregnancy occurs was also discussed with patient. Patient verbalized understanding and all questions were answered.Patient has been NPO since 0000 she will remain NPO for procedure. Anesthesia and OR aware. Preoperative prophylactic antibiotics and SCDs ordered on call to the OR.  To OR when  ready.     Carla DarterJames Ajay Bernard 10/14/2017, 10:44 AM

## 2017-10-14 NOTE — Anesthesia Preprocedure Evaluation (Signed)
Anesthesia Evaluation  Patient identified by MRN, date of birth, ID band Patient awake    Reviewed: Allergy & Precautions, H&P , NPO status , Patient's Chart, lab work & pertinent test results, reviewed documented beta blocker date and time   History of Anesthesia Complications (+) history of anesthetic complications  Airway Mallampati: II  TM Distance: >3 FB Neck ROM: full    Dental no notable dental hx.    Pulmonary neg pulmonary ROS,    Pulmonary exam normal breath sounds clear to auscultation       Cardiovascular negative cardio ROS Normal cardiovascular exam Rhythm:regular Rate:Normal     Neuro/Psych negative neurological ROS  negative psych ROS   GI/Hepatic negative GI ROS, Neg liver ROS,   Endo/Other  negative endocrine ROS  Renal/GU negative Renal ROS  negative genitourinary   Musculoskeletal   Abdominal (+) + obese,   Peds  Hematology negative hematology ROS (+)   Anesthesia Other Findings   Reproductive/Obstetrics (+) Pregnancy                             Anesthesia Physical Anesthesia Plan  ASA: II  Anesthesia Plan: Spinal   Post-op Pain Management:    Induction:   PONV Risk Score and Plan: 2 and Scopolamine patch - Pre-op  Airway Management Planned:   Additional Equipment:   Intra-op Plan:   Post-operative Plan:   Informed Consent: I have reviewed the patients History and Physical, chart, labs and discussed the procedure including the risks, benefits and alternatives for the proposed anesthesia with the patient or authorized representative who has indicated his/her understanding and acceptance.     Plan Discussed with: Anesthesiologist, CRNA and Surgeon  Anesthesia Plan Comments:         Anesthesia Quick Evaluation

## 2017-10-14 NOTE — Lactation Note (Signed)
This note was copied from a baby's chart. Lactation Consultation Note  Patient Name: Girl Theodoro GristClaudia Kats WUJWJ'XToday's Date: 10/14/2017 Reason for consult: Initial assessment;Term  7 hours old FT female who is being exclusively BF by her mother, she's a P5 and experienced BF. She was able to BF her other children for 24 months and feels pretty confident about it. Baby was asleep and swaddled when entering the room, per mom baby has not eaten since 1:23 pm, explained to mom the importance of BF con cues at least 8-12 times/24 hours but also to give baby an opportunity to feed placing her STS at the breast if no cues are observed in a 3 hours period.  LC undressed baby, check diapers and brought baby STS to the breast in football position to the right breast, but baby was unable to sustain the latch, she was very congested, used the bulb to suction some mucus but it wasn't very helpful, baby still having the same problem in the other breast. Explained to mom that when babies are full of fluid after a C-section they may not show a lot of feeding cues, and they may also get a stuffy nose. Suggested mom to hand express in order to give baby some calories, she was willing to try that until the congestions clears up. Mom also asked if she could get a hand pump, and possibly using it instead of hand expression. Pump guidelines, cleaning and storage was discussed.  Mom was able to hand express 3 ml of colostrum, LC showed dad how to spoon feed baby, baby was able to finish all her feeding. Encouraged mom to feed baby STS (at the breast or spoon feeding) at least 8-12 times/24 hours. Reviewed BF brochure (SP), BF resources and feeding diary, parents are aware of LC services and will call PRN.  Maternal Data Formula Feeding for Exclusion: No Has patient been taught Hand Expression?: Yes Does the patient have breastfeeding experience prior to this delivery?: Yes  Feeding Feeding Type: Breast Milk  LATCH  Score Latch: Too sleepy or reluctant, no latch achieved, no sucking elicited.  Audible Swallowing: None  Type of Nipple: Everted at rest and after stimulation  Comfort (Breast/Nipple): Soft / non-tender  Hold (Positioning): No assistance needed to correctly position infant at breast.  LATCH Score: 6  Interventions Interventions: Breast feeding basics reviewed;Assisted with latch;Skin to skin;Breast massage;Hand express;Breast compression;Adjust position;Support pillows;Position options;Expressed milk;Hand pump  Lactation Tools Discussed/Used Tools: Other (comment)(spoon) WIC Program: Yes Pump Review: Setup, frequency, and cleaning;Milk Storage Initiated by:: MPeck Date initiated:: 10/14/17   Consult Status Consult Status: Follow-up Date: 10/15/17 Follow-up type: In-patient    Crystol Walpole Venetia ConstableS Adrean Heitz 10/14/2017, 7:17 PM

## 2017-10-15 LAB — CBC
HCT: 32.6 % — ABNORMAL LOW (ref 36.0–46.0)
Hemoglobin: 10.9 g/dL — ABNORMAL LOW (ref 12.0–15.0)
MCH: 29.7 pg (ref 26.0–34.0)
MCHC: 33.4 g/dL (ref 30.0–36.0)
MCV: 88.8 fL (ref 78.0–100.0)
PLATELETS: 223 10*3/uL (ref 150–400)
RBC: 3.67 MIL/uL — ABNORMAL LOW (ref 3.87–5.11)
RDW: 13.9 % (ref 11.5–15.5)
WBC: 8.6 10*3/uL (ref 4.0–10.5)

## 2017-10-15 LAB — CREATININE, SERUM: CREATININE: 0.56 mg/dL (ref 0.44–1.00)

## 2017-10-15 NOTE — Progress Notes (Signed)
0645: Foley Cath d/c'd as ordered, peri care performed and pt ambulated along the hallway without difficulties.

## 2017-10-15 NOTE — Progress Notes (Signed)
Subjective: Postpartum Day 1: Cesarean Delivery Patient reports incisional pain, tolerating PO, + flatus and no problems voiding.    Objective: Vital signs in last 24 hours: Temp:  [97.7 F (36.5 C)-98.5 F (36.9 C)] 97.7 F (36.5 C) (04/14 0624) Pulse Rate:  [70-94] 76 (04/14 0624) Resp:  [13-26] 16 (04/14 0624) BP: (91-103)/(48-61) 93/48 (04/14 0624) SpO2:  [95 %-100 %] 96 % (04/14 0624) Weight:  [184 lb 8 oz (83.7 kg)] 184 lb 8 oz (83.7 kg) (04/13 1017)  Physical Exam:  General: alert, cooperative and no distress Lochia: appropriate Uterine Fundus: firm Incision: no significant drainage DVT Evaluation: No evidence of DVT seen on physical exam.  Recent Labs    10/13/17 1030 10/15/17 0528  HGB 13.0 10.9*  HCT 38.7 32.6*    Assessment/Plan: Status post Cesarean section. Doing well postoperatively.  Continue current care Plan for discharge tomorrow PP BTL  Caryl AdaJazma Jenessa Gillingham, DO 10/15/2017, 8:55 AM

## 2017-10-16 ENCOUNTER — Encounter (HOSPITAL_COMMUNITY): Payer: Self-pay | Admitting: *Deleted

## 2017-10-16 MED ORDER — OXYCODONE HCL 5 MG PO TABS: 5.0000 mg | ORAL_TABLET | Freq: Four times a day (QID) | ORAL | 0 refills | Status: DC | PRN

## 2017-10-16 MED ORDER — CYCLOBENZAPRINE HCL 5 MG PO TABS: 5.0000 mg | ORAL_TABLET | Freq: Three times a day (TID) | ORAL | 0 refills | Status: DC | PRN

## 2017-10-16 MED ORDER — IBUPROFEN 600 MG PO TABS: 600.0000 mg | ORAL_TABLET | Freq: Four times a day (QID) | ORAL | 0 refills | Status: DC

## 2017-10-16 NOTE — Discharge Instructions (Signed)

## 2017-10-16 NOTE — Lactation Note (Signed)
This note was copied from a baby's chart. Lactation Consultation Note  Patient Name: Carla Bernard Reason for consult: Follow-up assessment   Video spanish interpreter used. P5, Baby latched upon entering.  Sucks and swallows intermittently observed. Reassured mother. Mom encouraged to feed baby 8-12 times/24 hours and with feeding cues.  Mom made aware of O/P services, breastfeeding support groups, community resources, and our phone # for post-discharge questions.       Maternal Data Does the patient have breastfeeding experience prior to this delivery?: Yes  Feeding Feeding Type: Breast Fed Length of feed: 20 min  LATCH Score Latch: Grasps breast easily, tongue down, lips flanged, rhythmical sucking.(latched upon entering room)  Audible Swallowing: Spontaneous and intermittent  Type of Nipple: Everted at rest and after stimulation  Comfort (Breast/Nipple): Soft / non-tender  Hold (Positioning): Assistance needed to correctly position infant at breast and maintain latch.  LATCH Score: 9  Interventions Interventions: Breast feeding basics reviewed  Lactation Tools Discussed/Used     Consult Status Consult Status: Follow-up Date: 10/16/17 Follow-up type: In-patient    Dahlia ByesBerkelhammer, Carla Bernard LPBoschen Bernard, 12:20 PM

## 2017-10-16 NOTE — Discharge Summary (Addendum)
OB Discharge Summary     Patient Name: Carla Bernard DOB: June 20, 1976 MRN: [id number]  Date of admission: 10/14/2017 Delivering MD: Adam Phenix   Date of discharge: 10/16/2017  Admitting diagnosis: RCS Intrauterine pregnancy: [redacted]w[redacted]d     Secondary diagnosis:  Principal Problem:   Status post repeat low transverse cesarean section Active Problems:   History of cesarean delivery affecting pregnancy   Request for sterilization   Status post tubal ligation at time of delivery, current hosp  Additional problems: see above     Discharge diagnosis: Term Pregnancy Delivered                                                                                                Post partum procedures:postpartum tubal ligation  Augmentation: None  Complications: None  Hospital course: Scheduled Cesarean Section  42 y.o. yo G5P5005 at [redacted]w[redacted]d was admitted to the hospital 10/14/2017. The patient went for cesarean section due to Elective Repeat, and delivered a Viable infant,10/14/2017  Membrane Rupture Time/Date: 12:01 PM ,10/14/2017   Details of operation can be found in separate operative Note.  Patient had an uncomplicated postpartum course. She is ambulating, tolerating a regular diet, passing flatus, and urinating well.  Patient is discharged home in stable condition on 10/16/17.                                    Physical exam  Vitals:   10/15/17 1026 10/15/17 1426 10/15/17 1816 10/16/17 0513  BP: (!) 98/54 (!) 105/53 (!) 95/54 100/65  Pulse: 85 79 91 87  Resp: 18 20 18 18   Temp: 98.3 F (36.8 C) 98.5 F (36.9 C) 98.3 F (36.8 C) 98.2 F (36.8 C)  TempSrc: Oral Oral Oral Oral  SpO2: 97% 98% 99% 97%  Weight:      Height:       General: alert and no distress Lochia: appropriate Uterine Fundus: firm Incision: Healing well with no significant drainage, No significant erythema, Dressing is clean, dry, and intact DVT Evaluation: No evidence of DVT seen on physical exam. Labs: Lab  Results  Component Value Date   WBC 8.6 10/15/2017   HGB 10.9 (L) 10/15/2017   HCT 32.6 (L) 10/15/2017   MCV 88.8 10/15/2017   PLT 223 10/15/2017   CMP Latest Ref Rng & Units 10/15/2017  Creatinine 0.44 - 1.00 mg/dL 7.82    Discharge instruction: per After Visit Summary and "Baby and Me Booklet".  After visit meds:  Allergies as of 10/16/2017   No Known Allergies     Medication List    STOP taking these medications   PRENATAL PO   terconazole 0.8 % vaginal cream Commonly known as:  TERAZOL 3       Diet: routine diet  Activity: Advance as tolerated. Pelvic rest for 6 weeks.   Outpatient follow up:2 weeks Follow up Appt:No future appointments. Follow up Visit: Follow-up Information    Department, [facility] Go to.   Why:  6 weeks Contact information: [address] [place] [id number] [phone]           Postpartum contraception: Tubal Ligation  Newborn Data: Live born female  Birth Weight: 8 lb 1.1 oz (3660 g) APGAR: 9, 9  Newborn Delivery   Birth date/time:  10/14/2017 12:02:00 Delivery type:  C-Section, Low Transverse Trial of labor:  No C-section categorization:  Repeat     Baby Feeding: Breast Disposition:home with mother   10/16/2017 Wendee Beaversavid J McMullen, DO  OB FELLOW DISCHARGE ATTESTATION  I have seen and examined this patient. I agree with above documentation and have made edits as needed.   Caryl AdaJazma Phelps, DO OB Fellow 9:44 AM

## 2019-10-05 ENCOUNTER — Ambulatory Visit: Payer: Self-pay | Attending: Internal Medicine

## 2019-10-05 DIAGNOSIS — Z23 Encounter for immunization: Secondary | ICD-10-CM

## 2019-10-05 NOTE — Progress Notes (Signed)
   Covid-19 Vaccination Clinic  Name:  Carla Bernard    MRN: [id number] DOB: [birth year]  10/05/2019  [name] was observed post Covid-19 immunization for 15 minutes without incident. She was provided with Vaccine Information Sheet and instruction to access the V-Safe system.   [name] was instructed to call 911 with any severe reactions post vaccine: Marland Kitchen Difficulty breathing  . Swelling of face and throat  . A fast heartbeat  . A bad rash all over body  . Dizziness and weakness   Immunizations Administered    Name Date Dose VIS Date Route   Pfizer COVID-19 Vaccine 10/05/2019  4:47 PM 0.3 mL 06/14/2019 Intramuscular   Manufacturer: [employer], [employer]   Lot: [id number]   NDC: [address]-1

## 2019-10-30 ENCOUNTER — Ambulatory Visit: Payer: Self-pay | Attending: Internal Medicine

## 2019-10-30 DIAGNOSIS — Z23 Encounter for immunization: Secondary | ICD-10-CM

## 2019-10-30 NOTE — Progress Notes (Signed)
   Covid-19 Vaccination Clinic  Name:  Carla Bernard    MRN: [id number] DOB: 09-20-1975  10/30/2019  Carla Bernard was observed post Covid-19 immunization for 15 minutes without incident. She was provided with Vaccine Information Sheet and instruction to access the V-Safe system.   Carla Bernard was instructed to call 911 with any severe reactions post vaccine: Marland Kitchen Difficulty breathing  . Swelling of face and throat  . A fast heartbeat  . A bad rash all over body  . Dizziness and weakness   Immunizations Administered    Name Date Dose VIS Date Route   Pfizer COVID-19 Vaccine 10/30/2019  1:02 PM 0.3 mL 08/28/2018 Intramuscular   Manufacturer: [employer], [employer]   Lot: [id number]   NDC: [address]-4

## 2021-05-21 ENCOUNTER — Emergency Department (HOSPITAL_COMMUNITY)
Admission: EM | Admit: 2021-05-21 | Discharge: 2021-05-22 | Disposition: A | Discharge status: Home / Self Care | Payer: Self-pay | Attending: Emergency Medicine | Admitting: Emergency Medicine

## 2021-05-21 ENCOUNTER — Other Ambulatory Visit: Payer: Self-pay

## 2021-05-21 ENCOUNTER — Emergency Department (HOSPITAL_COMMUNITY): Payer: Self-pay

## 2021-05-21 ENCOUNTER — Encounter (HOSPITAL_COMMUNITY): Payer: Self-pay | Admitting: Emergency Medicine

## 2021-05-21 DIAGNOSIS — W19XXXA Unspecified fall, initial encounter: Secondary | ICD-10-CM | POA: Insufficient documentation

## 2021-05-21 DIAGNOSIS — F439 Reaction to severe stress, unspecified: Secondary | ICD-10-CM | POA: Insufficient documentation

## 2021-05-21 DIAGNOSIS — S6992XA Unspecified injury of left wrist, hand and finger(s), initial encounter: Secondary | ICD-10-CM | POA: Insufficient documentation

## 2021-05-21 DIAGNOSIS — R55 Syncope and collapse: Secondary | ICD-10-CM | POA: Insufficient documentation

## 2021-05-21 DIAGNOSIS — N9489 Other specified conditions associated with female genital organs and menstrual cycle: Secondary | ICD-10-CM | POA: Insufficient documentation

## 2021-05-21 LAB — CBC WITH DIFFERENTIAL/PLATELET
Abs Immature Granulocytes: 0.01 10*3/uL (ref 0.00–0.07)
Basophils Absolute: 0 10*3/uL (ref 0.0–0.1)
Basophils Relative: 1 %
Eosinophils Absolute: 0.1 10*3/uL (ref 0.0–0.5)
Eosinophils Relative: 1 %
HCT: 41.9 % (ref 36.0–46.0)
Hemoglobin: 13.5 g/dL (ref 12.0–15.0)
Immature Granulocytes: 0 %
Lymphocytes Relative: 33 %
Lymphs Abs: 1.6 10*3/uL (ref 0.7–4.0)
MCH: 29.2 pg (ref 26.0–34.0)
MCHC: 32.2 g/dL (ref 30.0–36.0)
MCV: 90.5 fL (ref 80.0–100.0)
Monocytes Absolute: 0.6 10*3/uL (ref 0.1–1.0)
Monocytes Relative: 13 %
Neutro Abs: 2.5 10*3/uL (ref 1.7–7.7)
Neutrophils Relative %: 52 %
Platelets: 280 10*3/uL (ref 150–400)
RBC: 4.63 MIL/uL (ref 3.87–5.11)
RDW: 12.3 % (ref 11.5–15.5)
WBC: 4.7 10*3/uL (ref 4.0–10.5)
nRBC: 0 % (ref 0.0–0.2)

## 2021-05-21 LAB — BASIC METABOLIC PANEL
Anion gap: 7 (ref 5–15)
BUN: 15 mg/dL (ref 6–20)
CO2: 27 mmol/L (ref 22–32)
Calcium: 9.1 mg/dL (ref 8.9–10.3)
Chloride: 104 mmol/L (ref 98–111)
Creatinine, Ser: 0.77 mg/dL (ref 0.44–1.00)
GFR, Estimated: >60 mL/min (ref >60)
Glucose, Bld: 108 mg/dL — ABNORMAL HIGH (ref 70–99)
Potassium: 3.7 mmol/L (ref 3.5–5.1)
Sodium: 138 mmol/L (ref 135–145)

## 2021-05-21 LAB — I-STAT BETA HCG BLOOD, ED (MC, WL, AP ONLY): I-stat hCG, quantitative: <5 m[IU]/mL (ref <5)

## 2021-05-21 NOTE — ED Provider Notes (Signed)
Emergency Medicine Provider Triage Evaluation Note  Carla Bernard , a 45 y.o. female  was evaluated in triage.  Pt complains of left shoulder pain, left wrist pain, and syncopal episode.  Syncopal episode occurred today at approximately 1200.  Patient states that she went from a sitting to standing position prior to her syncopal event.  Denies any preceding chest pain, shortness of breath, or headache.  Patient complains of pain to left shoulder and left wrist after her fall.  Patient reports she is not on any blood thinners.  Review of Systems  Positive: Syncope, left wrist pain, left shoulder pain, Negative: Facial asymmetry, dysarthria, numbness, weakness, visual disturbance, chest pain, shortness of breath, palpitations  Physical Exam  BP 125/90 (BP Location: Right Arm)   Pulse 94   Temp 99.6 F (37.6 C)   Resp 16   SpO2 98%  Gen:   Awake, no distress   Resp:  Normal effort  MSK:   Moves extremities without difficulty; diffuse tenderness to left shoulder and left wrist.  No deformity noted.  Pulse, motor, and sensation intact distally. Other:  No facial asymmetry or dysarthria.  Negative pronator drift.  Moves all extremities equally without difficulty.  Medical Decision Making  Medically screening exam initiated at 4:45 PM.  Appropriate orders placed.  Carla Bernard was informed that the remainder of the evaluation will be completed by another provider, this initial triage assessment does not replace that evaluation, and the importance of remaining in the ED until their evaluation is complete.     Haskel Schroeder, PA-C 05/21/21 1647    Bethann Berkshire, MD 05/21/21 2215

## 2021-05-21 NOTE — ED Triage Notes (Signed)
Pt states she was sitting watching tv and stood up and passed out landing on her left side.  Pt states she has pain in her left arm and a "numbness" in her head.  Pt denies any hx of this in the past.  No n/v/d or shob.  Neuro assessment normal at time of triage.

## 2021-05-22 ENCOUNTER — Emergency Department (HOSPITAL_COMMUNITY): Payer: Self-pay

## 2021-05-22 MED ORDER — LACTATED RINGERS IV BOLUS: 1000.0000 mL | Freq: Once | INTRAVENOUS | Status: DC

## 2021-05-22 MED ORDER — KETOROLAC TROMETHAMINE 60 MG/2ML IM SOLN
60.0000 mg | Freq: Once | INTRAMUSCULAR | Status: AC
  Administered 2021-05-22: 60 mg via INTRAMUSCULAR
  Filled 2021-05-22: qty 2

## 2021-05-22 MED ORDER — IBUPROFEN 800 MG PO TABS: 800.0000 mg | ORAL_TABLET | Freq: Three times a day (TID) | ORAL | 0 refills | Status: AC

## 2021-05-22 MED ORDER — KETOROLAC TROMETHAMINE 30 MG/ML IJ SOLN: 15.0000 mg | Freq: Once | INTRAMUSCULAR | Status: DC

## 2021-05-22 MED ORDER — METHOCARBAMOL 500 MG PO TABS: 500.0000 mg | ORAL_TABLET | Freq: Two times a day (BID) | ORAL | 0 refills | Status: AC

## 2021-05-22 NOTE — ED Provider Notes (Signed)
[facility] EMERGENCY DEPARTMENT Provider Note   CSN: [id number] Arrival date & time: 05/21/21  1618     History No chief complaint on file.   Carla Bernard is a 45 y.o. female.  45 year old female who is been under a lot of stress recently not eating drinking normally per her and her daughter that presents emerged from today with a near syncopal/syncopal episode along with left-sided pain.  Patient states she was stuffing chair real fast and she got lightheaded and fell down.  She states that she hurt her left shoulder and her left wrist but since we have to wait and wait in room for about 10 hours she also has right sided lumbar paraspinal pain.  Patient also states that while she was in the waiting room she had an episode where she could not move her left hand.  She thinks is related to pain but since her left side of her neck also hurts she was also concerned for possible injury to her neck.       Past Medical History:  Diagnosis Date   Complication of anesthesia    Medical history non-contributory     Patient Active Problem List   Diagnosis Date Noted   History of cesarean delivery affecting pregnancy 10/14/2017   Request for sterilization 10/14/2017   Status post repeat low transverse cesarean section 10/14/2017   Status post tubal ligation at time of delivery, current hosp 10/14/2017    Past Surgical History:  Procedure Laterality Date   CESAREAN SECTION     C/S x 4   CESAREAN SECTION WITH BILATERAL TUBAL LIGATION N/A 10/14/2017   Procedure: CESAREAN SECTION WITH BILATERAL TUBAL LIGATION;  Surgeon: Adam Phenix, MD;  Location: [facility] BIRTHING SUITES;  Service: Obstetrics;  Laterality: N/A;     OB History     Gravida  5   Para  5   Term  5   Preterm      AB      Living  5      SAB      IAB      Ectopic      Multiple  0   Live Births  5           No family history on file.  Social History   Tobacco Use   Smoking  status: Never   Smokeless tobacco: Never  Substance Use Topics   Alcohol use: No   Drug use: No    Home Medications Prior to Admission medications   Medication Sig Start Date End Date Taking? Authorizing Provider  ibuprofen (ADVIL) 800 MG tablet Take 1 tablet (800 mg total) by mouth 3 (three) times daily. 05/22/21  Yes Rochell Puett, Barbara Cower, MD  methocarbamol (ROBAXIN) 500 MG tablet Take 1 tablet (500 mg total) by mouth 2 (two) times daily for 7 days. 05/22/21 05/29/21 Yes Vena Bassinger, Barbara Cower, MD    Allergies    Patient has no known allergies.  Review of Systems   Review of Systems  All other systems reviewed and are negative.  Physical Exam Updated Vital Signs BP 103/71 (BP Location: Right Arm)   Pulse 67   Temp 98.9 F (37.2 C)   Resp 16   SpO2 98%   Physical Exam Vitals and nursing note reviewed.  Constitutional:      Appearance: She is well-developed.  HENT:     Head: Normocephalic and atraumatic.     Nose: Nose normal. No congestion or rhinorrhea.  Eyes:  Pupils: Pupils are equal, round, and reactive to light.  Cardiovascular:     Rate and Rhythm: Normal rate and regular rhythm.  Pulmonary:     Effort: No respiratory distress.     Breath sounds: No stridor.  Abdominal:     General: Abdomen is flat. There is no distension.  Musculoskeletal:        General: Tenderness (left wrist) present. No swelling. Normal range of motion.     Cervical back: Normal range of motion.  Skin:    General: Skin is warm and dry.  Neurological:     General: No focal deficit present.     Mental Status: She is alert.    ED Results / Procedures / Treatments   Labs (all labs ordered are listed, but only abnormal results are displayed) Labs Reviewed  BASIC METABOLIC PANEL - Abnormal; Notable for the following components:      Result Value   Glucose, Bld 108 (*)    All other components within normal limits  CBC WITH DIFFERENTIAL/PLATELET  I-STAT BETA HCG BLOOD, ED ([facility], [facility], [facility] ONLY)     EKG EKG Interpretation  Date/Time:  Friday May 21 2021 16:38:17 EST Ventricular Rate:  87 PR Interval:  144 QRS Duration: 86 QT Interval:  362 QTC Calculation: 435 R Axis:   18 Text Interpretation: Normal sinus rhythm Cannot rule out Anterior infarct , age undetermined Abnormal ECG Confirmed by Marily Memos [phone]) on 05/22/2021 2:43:04 AM  Radiology DG Wrist Complete Left  Result Date: 05/21/2021 CLINICAL DATA:  Fall. EXAM: LEFT WRIST - COMPLETE 3+ VIEW COMPARISON:  None. FINDINGS: There is no evidence of fracture or dislocation. There is no evidence of arthropathy or other focal bone abnormality. There is soft tissue swelling surrounding the wrist. IMPRESSION: No acute bony abnormality. Electronically Signed   By: Darliss Cheney M.D.   On: 05/21/2021 18:19   CT Head Wo Contrast  Result Date: 05/22/2021 CLINICAL DATA:  Neck trauma, focal neuro deficit or paresthesia (Age 22-64y) Neck trauma, impaired ROM (Age 65-64y); Head trauma, focal neuro findings (Age 96-64y) EXAM: CT HEAD WITHOUT CONTRAST CT CERVICAL SPINE WITHOUT CONTRAST TECHNIQUE: Multidetector CT imaging of the head and cervical spine was performed following the standard protocol without intravenous contrast. Multiplanar CT image reconstructions of the cervical spine were also generated. COMPARISON:  None. FINDINGS: CT HEAD FINDINGS Brain: Normal anatomic configuration. No abnormal intra or extra-axial mass lesion or fluid collection. No abnormal mass effect or midline shift. No evidence of acute intracranial hemorrhage or infarct. Ventricular size is normal. Cerebellum unremarkable. Vascular: Unremarkable Skull: Intact Sinuses/Orbits: Paranasal sinuses are clear. Orbits are unremarkable. Other: Mastoid air cells and middle ear cavities are clear. CT CERVICAL SPINE FINDINGS Alignment: Minimal cervical lordosis is likely positional in nature. No listhesis. Skull base and vertebrae: Craniocervical alignment is normal. The  atlantodental interval is not widened. No acute fracture of the cervical spine. Vertebral body height has been preserved. Soft tissues and spinal canal: No prevertebral fluid or swelling. No visible canal hematoma. There is shotty bilateral submandibular, anterior, and posterior jugulodigastric lymphadenopathy which may be reactive in nature. These measure up to 10 mm in short axis diameter within the submandibular lymph node groups bilaterally. Disc levels: There is intervertebral disc space narrowing and endplate remodeling at C6-7 and to a lesser extent C5-6 in keeping with changes of mild to moderate degenerative disc disease. Remaining intervertebral disc heights are preserved. Vertebral body heights are preserved. The prevertebral soft tissues are not thickened on  sagittal reformats. Eccentric disc herniation at C5-6 likely narrows the left neural foramen and results in impingement of the exiting C6 nerve root. Posterior disc osteophyte complex at C6-7 abuts and mildly flattens the thecal sac in keeping with at least mild central canal stenosis. Review of the axial images demonstrates no additional significant neuroforaminal narrowing. Upper chest: Unremarkable Other: None IMPRESSION: No acute intracranial injury.  No calvarial fracture. No acute fracture or listhesis of the cervical spine. 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 Mild central canal stenosis C6-7. Electronically Signed   By: Helyn Numbers M.D.   On: 05/22/2021 03:40   CT Cervical Spine Wo Contrast  Result Date: 05/22/2021 CLINICAL DATA:  Neck trauma, focal neuro deficit or paresthesia (Age 47-64y) Neck trauma, impaired ROM (Age 47-64y); Head trauma, focal neuro findings (Age 74-64y) EXAM: CT HEAD WITHOUT CONTRAST CT CERVICAL SPINE WITHOUT CONTRAST TECHNIQUE: Multidetector CT imaging of the head and cervical spine was performed following the standard protocol without  intravenous contrast. Multiplanar CT image reconstructions of the cervical spine were also generated. COMPARISON:  None. FINDINGS: CT HEAD FINDINGS Brain: Normal anatomic configuration. No abnormal intra or extra-axial mass lesion or fluid collection. No abnormal mass effect or midline shift. No evidence of acute intracranial hemorrhage or infarct. Ventricular size is normal. Cerebellum unremarkable. Vascular: Unremarkable Skull: Intact Sinuses/Orbits: Paranasal sinuses are clear. Orbits are unremarkable. Other: Mastoid air cells and middle ear cavities are clear. CT CERVICAL SPINE FINDINGS Alignment: Minimal cervical lordosis is likely positional in nature. No listhesis. Skull base and vertebrae: Craniocervical alignment is normal. The atlantodental interval is not widened. No acute fracture of the cervical spine. Vertebral body height has been preserved. Soft tissues and spinal canal: No prevertebral fluid or swelling. No visible canal hematoma. There is shotty bilateral submandibular, anterior, and posterior jugulodigastric lymphadenopathy which may be reactive in nature. These measure up to 10 mm in short axis diameter within the submandibular lymph node groups bilaterally. Disc levels: There is intervertebral disc space narrowing and endplate remodeling at C6-7 and to a lesser extent C5-6 in keeping with changes of mild to moderate degenerative disc disease. Remaining intervertebral disc heights are preserved. Vertebral body heights are preserved. The prevertebral soft tissues are not thickened on sagittal reformats. Eccentric disc herniation at C5-6 likely narrows the left neural foramen and results in impingement of the exiting C6 nerve root. Posterior disc osteophyte complex at C6-7 abuts and mildly flattens the thecal sac in keeping with at least mild central canal stenosis. Review of the axial images demonstrates no additional significant neuroforaminal narrowing. Upper chest: Unremarkable Other: None  IMPRESSION: No acute intracranial injury.  No calvarial fracture. No acute fracture or listhesis of the cervical spine. 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 Mild central canal stenosis C6-7. Electronically Signed   By: Helyn Numbers M.D.   On: 05/22/2021 03:40   DG Shoulder Left  Result Date: 05/21/2021 CLINICAL DATA:  Pain, fall EXAM: LEFT SHOULDER - 2+ VIEW COMPARISON:  None. FINDINGS: There is no evidence of fracture or dislocation. Joint spaces are maintained. Soft tissue calcification adjacent to the greater tuberosity likely related to calcific tendinitis. Soft tissues are unremarkable. IMPRESSION: 1. No acute bony abnormality. 2. Calcific tendinitis. Electronically Signed   By: Darliss Cheney M.D.   On: 05/21/2021 18:14    Procedures Procedures   Medications Ordered in ED Medications  ketorolac (TORADOL) injection 60 mg (  60 mg Intramuscular Given 05/22/21 0417)    ED Course  I have reviewed the triage vital signs and the nursing notes.  Pertinent labs & imaging results that were available during my care of the patient were reviewed by me and considered in my medical decision making (see chart for details).    MDM Rules/Calculators/A&P                         Secondary to the left arm symptoms with syncope and neck pain did do a CT of her head and neck.  This was negative.  Patient is neurologically close to baseline she has some decreased movement of her left wrist secondary to pain but I think this is not neurologic at this time.  Patient is okay with going home.  She will hydrate well every ministry of speech.  Follow-up with PCP for further work-up as needed.  Return here if  anything worsens.  Final Clinical Impression(s) / ED Diagnoses Final diagnoses:  Syncope, unspecified syncope type  Fall, initial encounter  Stress at home    Rx / DC Orders ED Discharge Orders          Ordered    methocarbamol  (ROBAXIN) 500 MG tablet  2 times daily        05/22/21 0409    ibuprofen (ADVIL) 800 MG tablet  3 times daily        05/22/21 0409             Angelyse Heslin, Barbara Cower, MD 05/23/21 0121

## 2021-05-22 NOTE — ED Notes (Signed)
Patient taken to CT at this time.

## 2022-02-21 ENCOUNTER — Other Ambulatory Visit: Payer: Self-pay

## 2022-02-21 DIAGNOSIS — Z1231 Encounter for screening mammogram for malignant neoplasm of breast: Secondary | ICD-10-CM

## 2022-04-12 ENCOUNTER — Ambulatory Visit
Admission: RE | Admit: 2022-04-12 | Discharge: 2022-04-12 | Disposition: A | Discharge status: Home / Self Care | Payer: Self-pay | Source: Ambulatory Visit | Attending: Obstetrics and Gynecology | Admitting: Obstetrics and Gynecology

## 2022-04-12 ENCOUNTER — Ambulatory Visit: Payer: Self-pay | Admitting: *Deleted

## 2022-04-12 VITALS — BP 108/72 | Wt 170.9 lb

## 2022-04-12 DIAGNOSIS — Z01419 Encounter for gynecological examination (general) (routine) without abnormal findings: Secondary | ICD-10-CM

## 2022-04-12 DIAGNOSIS — Z1211 Encounter for screening for malignant neoplasm of colon: Secondary | ICD-10-CM

## 2022-04-12 DIAGNOSIS — Z1231 Encounter for screening mammogram for malignant neoplasm of breast: Secondary | ICD-10-CM

## 2022-04-12 NOTE — Progress Notes (Signed)
[name] is a 46 y.o. [id number] female who presents to [facility] clinic today with no complaints.    Pap Smear: Pap smear completed today. Last Pap smear was 03/29/2013 at [facility] and was normal. Per patient has no history of an abnormal Pap smear. Last Pap smear result is available in [facility].   Physical exam: Breasts Breasts symmetrical. No skin abnormalities bilateral breasts. No nipple retraction bilateral breasts. No nipple discharge bilateral breasts. No lymphadenopathy. No lumps palpated bilateral breasts. No Complaints of pain or tenderness on exam.       Pelvic/Bimanual Ext Genitalia No lesions, no swelling and no discharge observed on external genitalia.        Vagina Vagina pink and normal texture. No lesions or discharge observed in vagina.        Cervix Cervix is present. Cervix pink and of normal texture. No discharge observed.    Uterus Uterus is present and palpable. Uterus in normal position and normal size.        Adnexae Bilateral ovaries present and palpable. No tenderness on palpation.         Rectovaginal No rectal exam completed today since patient had no rectal complaints. No skin abnormalities observed on exam.     Smoking History: Patient has never smoked.   Patient Navigation: Patient education provided. Access to services provided for patient through [facility] program. Spanish interpreter Carla Bernard from [facility] provided.   Colorectal Cancer Screening: Per patient has never had colonoscopy completed. FIT Test given to patient to complete. No complaints today.    Breast and Cervical Cancer Risk Assessment: Patient has family history of a maternal first cousin having breast cancer. Patient has no known genetic mutations or history of radiation treatment to the chest before age 62. Patient does not have history of cervical dysplasia, immunocompromised, or DES exposure in-utero.  Risk Assessment      Risk Scores       04/12/2022   Last edited by: Carla Bernard, CMA   5-year risk: 0.5 %   Lifetime risk: 6 %            A: [facility] exam with pap smear No complaints.  P: Referred patient to the [facility] for a screening mammogram on mobile unit. Appointment scheduled Tuesday, April 12, 2022 at 1100.  Carla Parish, RN 04/12/2022 10:36 AM

## 2022-04-12 NOTE — Patient Instructions (Addendum)
Explained breast self awareness with Carla Bernard. Pap smear completed today. Let her know [facility] will cover Pap smears and HPV typing every 5 years unless has a history of abnormal Pap smears. Referred patient to the [facility] for a screening mammogram on mobile unit. Appointment scheduled Tuesday, April 12, 2022 at 1100. Patient aware of appointment and will be there. Let patient know will follow up with her within the next couple weeks with results of Pap smear by phone. Informed patient that [facility] will [facility] up with her within the next couple of weeks with results of her mammogram by letter or phone. Jaleena Viviani verbalized understanding.  Verenis Nicosia, Arvil Chaco, RN 10:36 AM

## 2022-04-13 LAB — CYTOLOGY - PAP
Adequacy: ABSENT
Comment: NEGATIVE
Diagnosis: NEGATIVE
High risk HPV: NEGATIVE

## 2022-04-20 ENCOUNTER — Other Ambulatory Visit: Payer: Self-pay

## 2022-04-20 ENCOUNTER — Telehealth: Payer: Self-pay

## 2022-04-20 NOTE — Telephone Encounter (Signed)
04/14/2022-Via Lavon Paganini, Spanish Interpreter [facility]), Patient informed negative Pap/HPV results, repeat pap in 5 years, verbalized understanding.

## 2022-05-09 ENCOUNTER — Other Ambulatory Visit: Payer: Self-pay

## 2022-05-09 MED ORDER — FLUCONAZOLE 150 MG PO TABS: 150.0000 mg | ORAL_TABLET | Freq: Once | ORAL | 0 refills | Status: AC

## 2022-05-11 ENCOUNTER — Telehealth: Payer: Self-pay

## 2022-05-11 NOTE — Telephone Encounter (Deleted)
Via, Carla Bernard, Spanish Interpreter [facility]) Patient informed pap results also revealed yeast infection, if has symptoms can call in rx. Patient stated that she is having vaginal itching, white discharge. Rx sent to pharmacy. [facility]

## 2022-05-11 NOTE — Telephone Encounter (Signed)
Incorrect patient

## 2022-06-03 ENCOUNTER — Telehealth: Payer: Self-pay

## 2022-06-03 NOTE — Telephone Encounter (Signed)
**Note De-Identified Carla Obfuscation** Carla Bernard, Spanish Interpreter, Patient was contacted to clarify Right breast/axilla symptoms. Patient stated that she had itchy, rash in her Right arm pit that became sore, swollen. Patient stated she no longer has symptoms as she applied Mamisan Unguento (Ointment used in [place]) which cleared the rash and itching. Patient informed to call as needed.

## 2023-10-09 IMAGING — CT CT HEAD W/O CM
3 of 4 series · 13 of 47 positions shown, 15 images · non-contrast
Comparison: None.

CLINICAL DATA: Neck trauma, focal neuro deficit or paresthesia (Age
16-64y) Neck trauma, impaired ROM (Age 16-64y); Head trauma, focal
neuro findings (Age 19-64y)

EXAM:
CT HEAD WITHOUT CONTRAST
CT CERVICAL SPINE WITHOUT CONTRAST
TECHNIQUE: Multidetector CT imaging of the head and cervical spine was
performed following the standard protocol without intravenous
contrast. Multiplanar CT image reconstructions of the cervical spine
were also generated.

[Series 4: head without · axial · non-contrast · 0.39mm/px · z∈[+1044,+1154]mm · 7 of 30 slices shown, 9 images]
[im 4/30  brain]
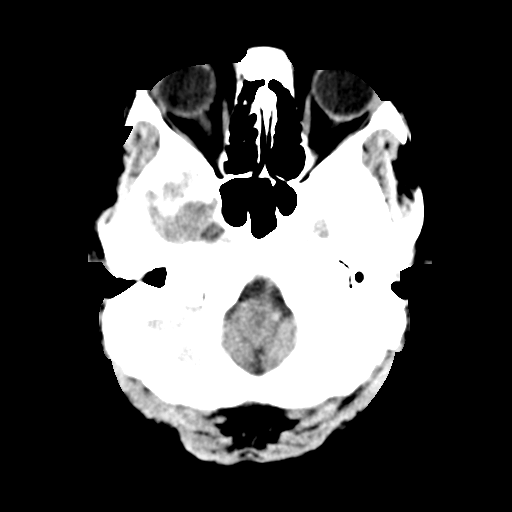
[im 4/30  bone]
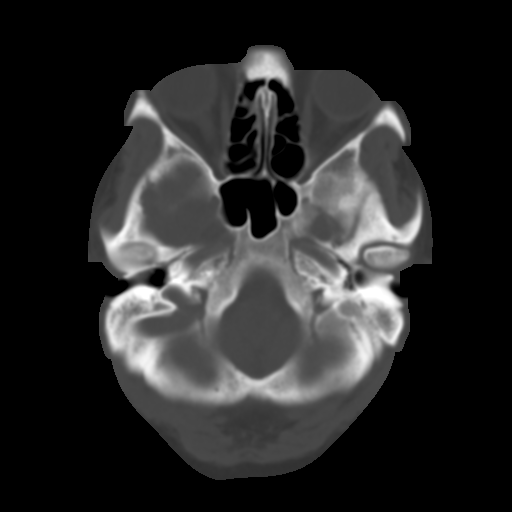
[im 8/30  brain]
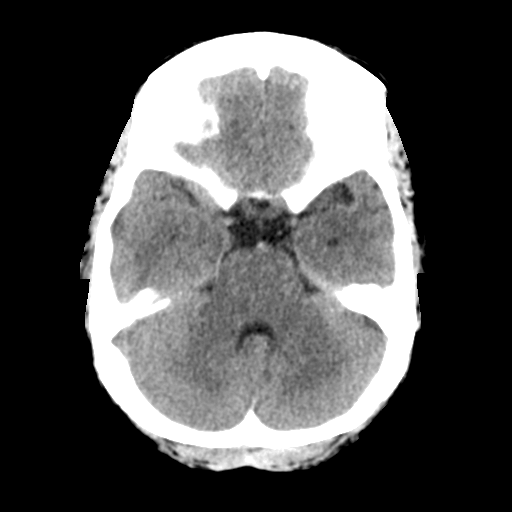
[im 11/30  brain]
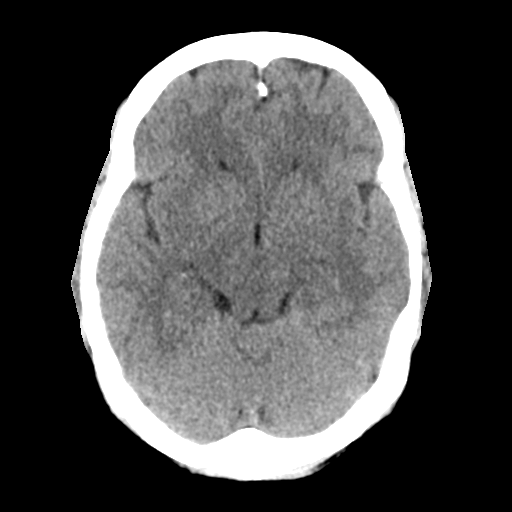
[im 15/30  brain]
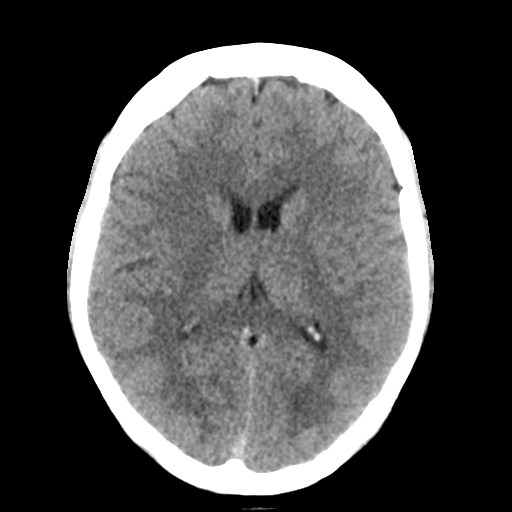
[im 19/30  brain]
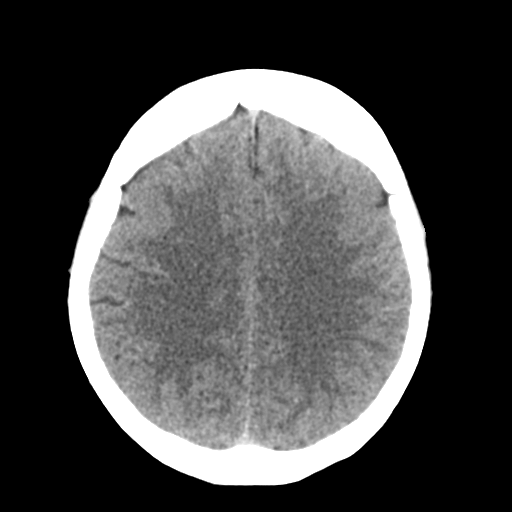
[im 19/30  bone]
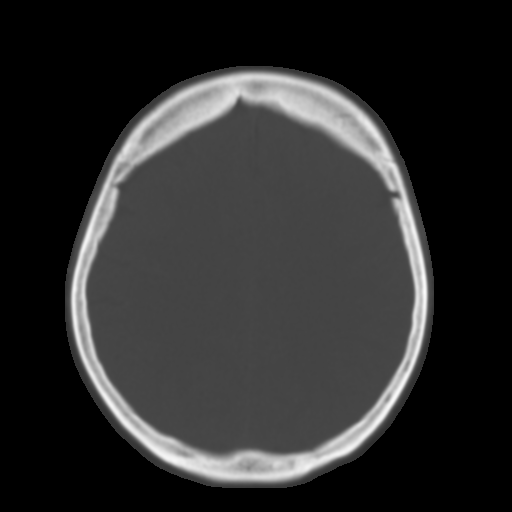
[im 22/30  brain]
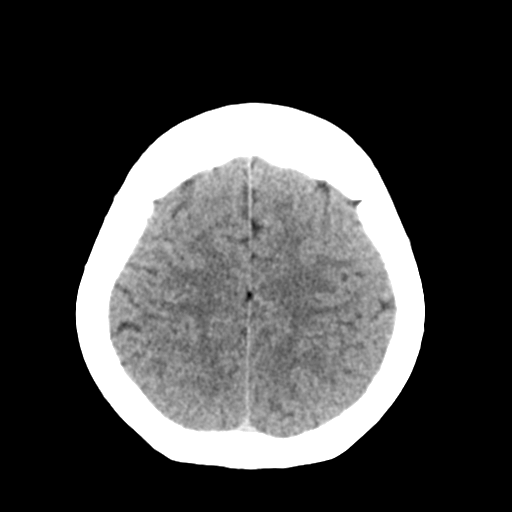
[im 26/30  brain]
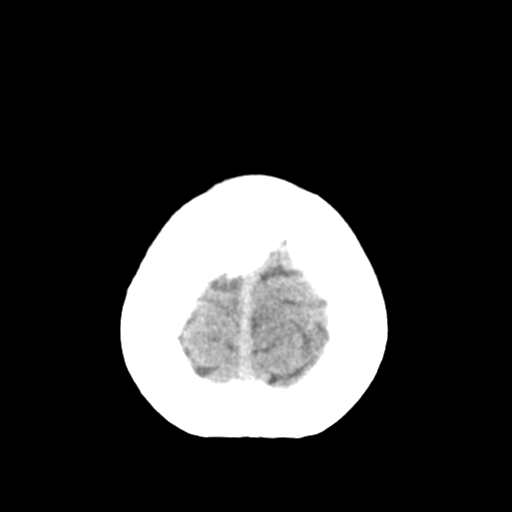

[Series 6: head without cor · coronal · non-contrast · 0.29mm/px · 3 of 69 slices shown]
[im 23/69  brain]
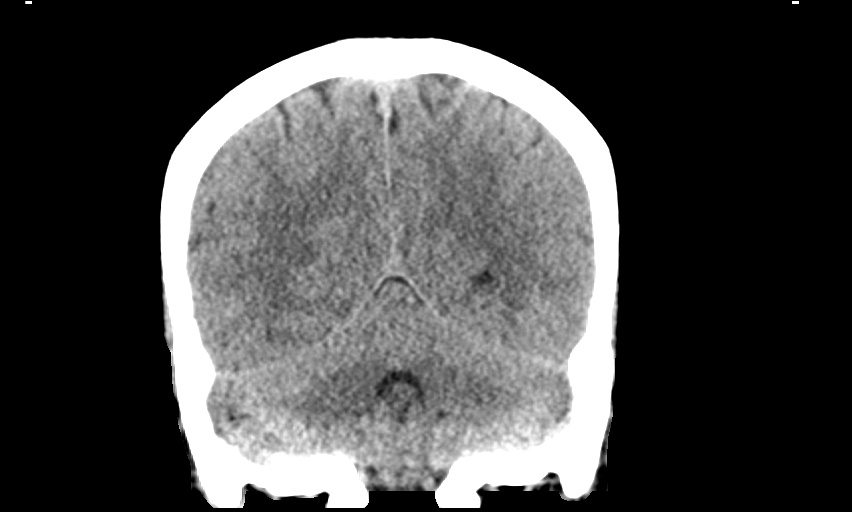
[im 31/69  brain]
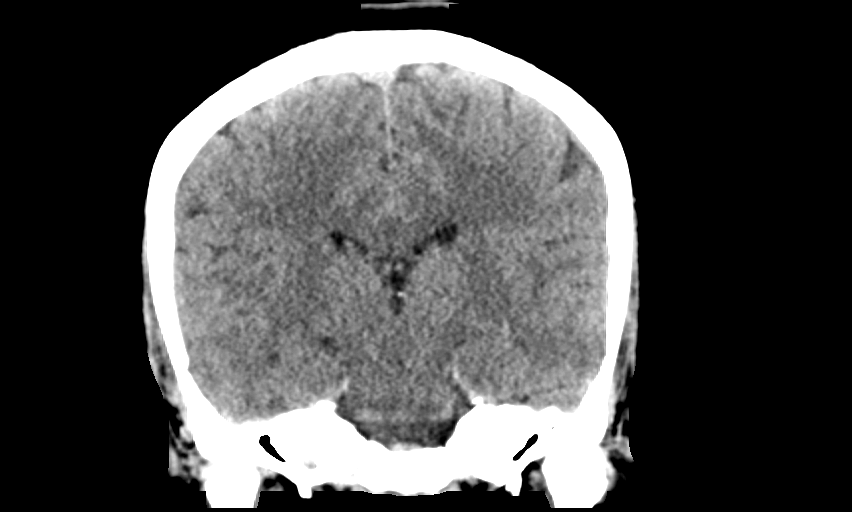
[im 38/69  brain]
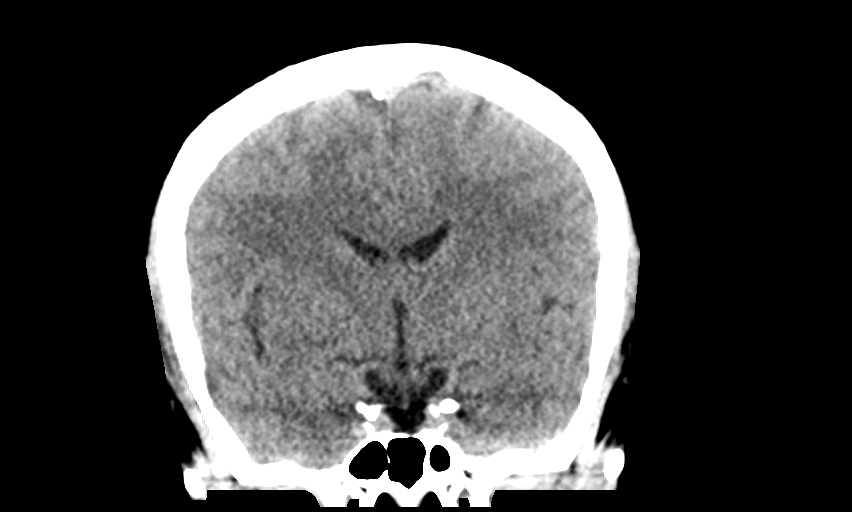

[Series 7: head without sag · sagittal · non-contrast · 0.30mm/px · 3 of 67 slices shown]
[im 23/67  brain]
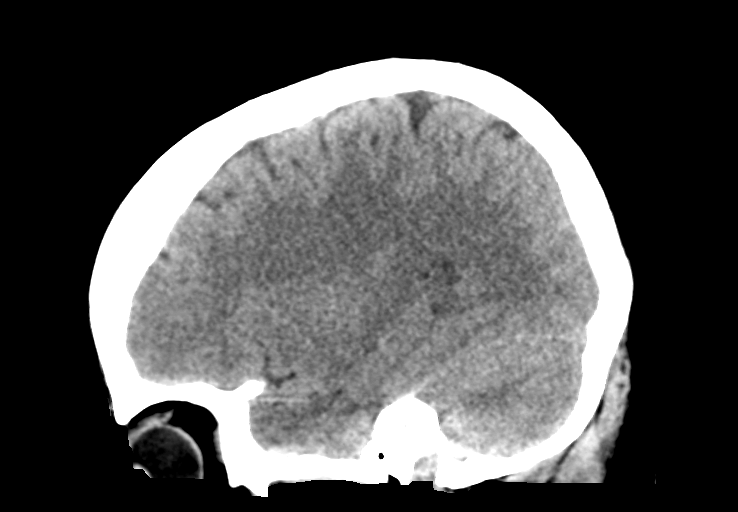
[im 34/67  brain]
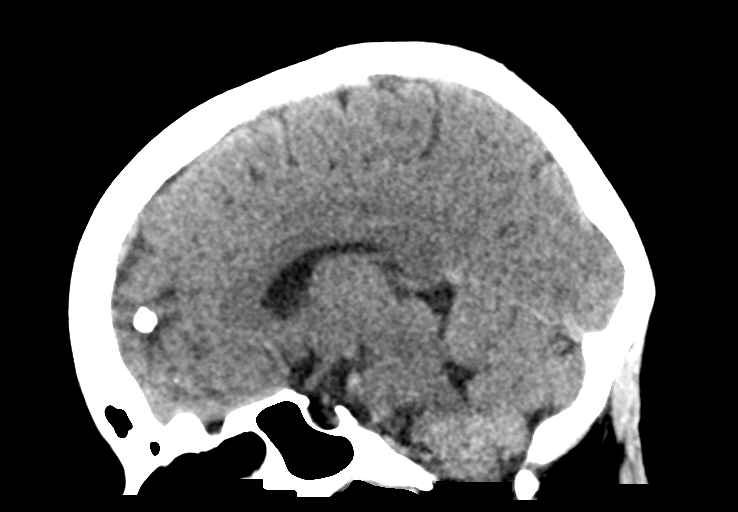
[im 45/67  brain]
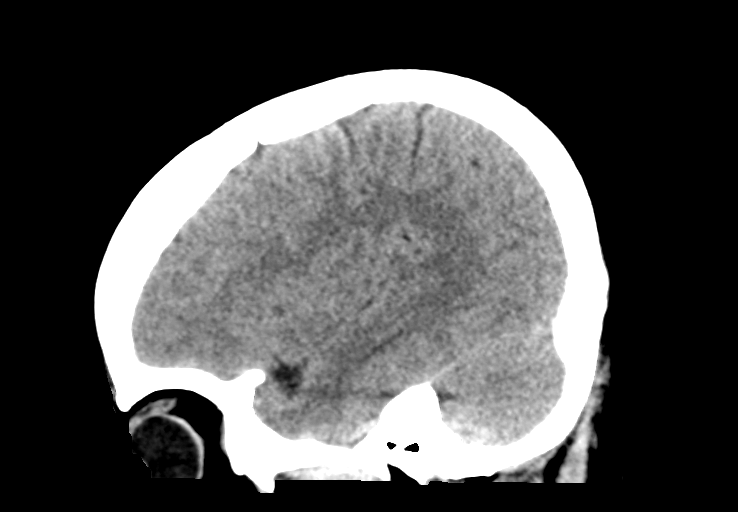

[13 of 47 positions shown; findings below may reference images not displayed]

FINDINGS: CT HEAD FINDINGS

Brain: Normal anatomic configuration. No abnormal intra or
extra-axial mass lesion or fluid collection. No abnormal mass effect
or midline shift. No evidence of acute intracranial hemorrhage or
infarct. Ventricular size is normal. Cerebellum unremarkable.

Vascular: Unremarkable

Skull: Intact

Sinuses/Orbits: Paranasal sinuses are clear. Orbits are
unremarkable.

Other: Mastoid air cells and middle ear cavities are clear.

CT CERVICAL SPINE FINDINGS

Alignment: Minimal cervical lordosis is likely positional in nature.
No listhesis.

Skull base and vertebrae: Craniocervical alignment is normal. The
atlantodental interval is not widened. No acute fracture of the
cervical spine. Vertebral body height has been preserved.

Soft tissues and spinal canal: No prevertebral fluid or swelling. No
visible canal hematoma. There is shotty bilateral submandibular,
anterior, and posterior jugulodigastric lymphadenopathy which may be
reactive in nature. These measure up to 10 mm in short axis diameter
within the submandibular lymph node groups bilaterally.

Disc levels: There is intervertebral disc space narrowing and
endplate remodeling at C6-7 and to a lesser extent C5-6 in keeping
with changes of mild to moderate degenerative disc disease.
Remaining intervertebral disc heights are preserved. Vertebral body
heights are preserved. The prevertebral soft tissues are not
thickened on sagittal reformats. Eccentric disc herniation at C5-6
likely narrows the left neural foramen and results in impingement of
the exiting C6 nerve root. Posterior disc osteophyte complex at C6-7
abuts and mildly flattens the thecal sac in keeping with at least
mild central canal stenosis. Review of the axial images demonstrates
no additional significant neuroforaminal narrowing.

Upper chest: Unremarkable

Other: None
IMPRESSION: No acute intracranial injury.  No calvarial fracture.

No acute fracture or listhesis of the cervical spine.

Eccentric left posterolateral disc herniation C5-6 with probable
impingement of the exiting C6 nerve root.

Mild central canal stenosis C6-7.

## 2023-10-09 IMAGING — CT CT CERVICAL SPINE W/O CM
3 of 4 series · 13 of 33 positions shown, 16 images · non-contrast
Comparison: None.

CLINICAL DATA: Neck trauma, focal neuro deficit or paresthesia (Age
16-64y) Neck trauma, impaired ROM (Age 16-64y); Head trauma, focal
neuro findings (Age 19-64y)

EXAM:
CT HEAD WITHOUT CONTRAST
CT CERVICAL SPINE WITHOUT CONTRAST
TECHNIQUE: Multidetector CT imaging of the head and cervical spine was
performed following the standard protocol without intravenous
contrast. Multiplanar CT image reconstructions of the cervical spine
were also generated.

[Series 1: c_spine 2.0 st · axial · 0.35mm/px · z∈[+894,+1014]mm · 5 of 90 slices shown, 7 images]
[im 15/90  soft-tissue]
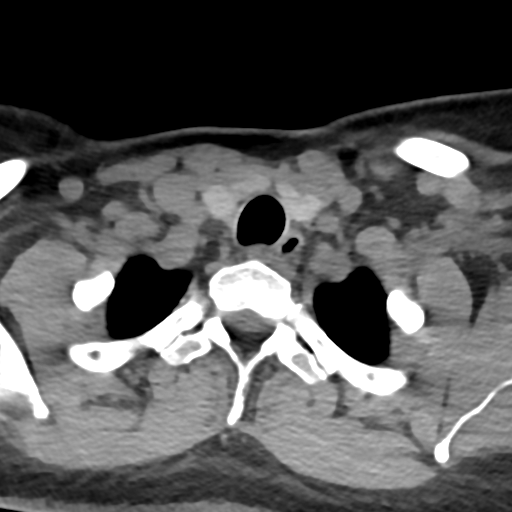
[im 15/90  bone]
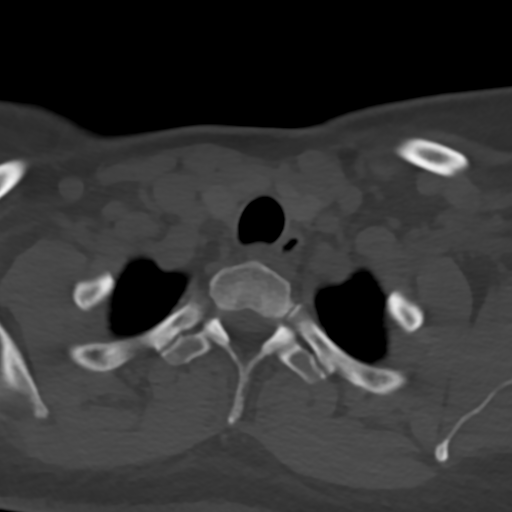
[im 30/90  bone]
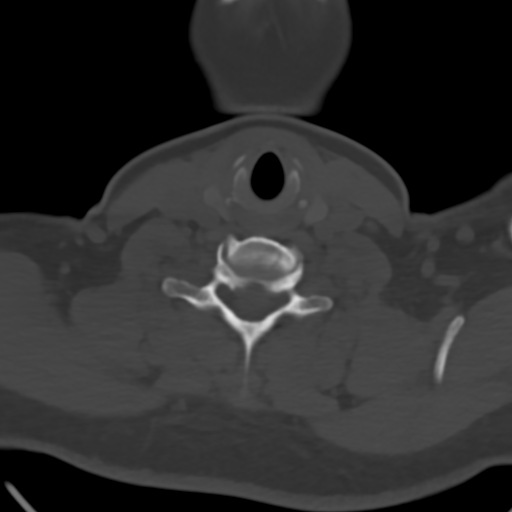
[im 45/90  bone]
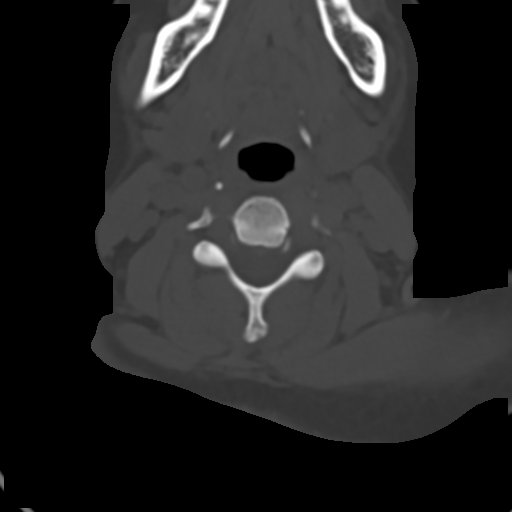
[im 60/90  bone]
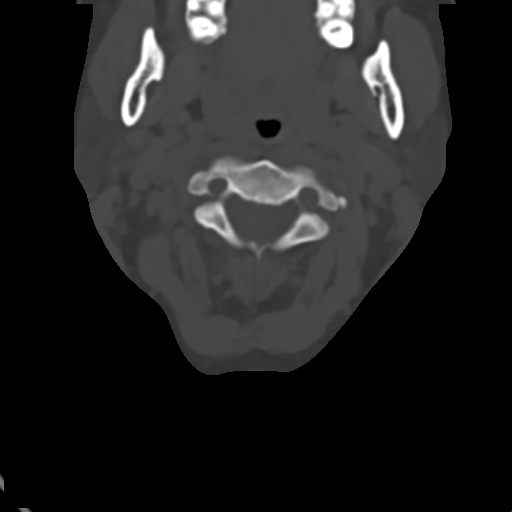
[im 75/90  soft-tissue]
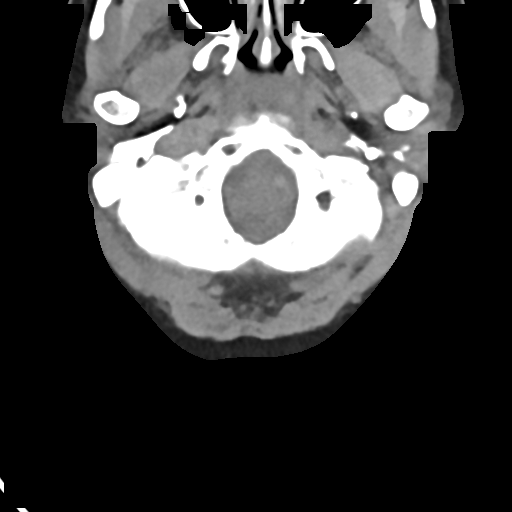
[im 75/90  bone]
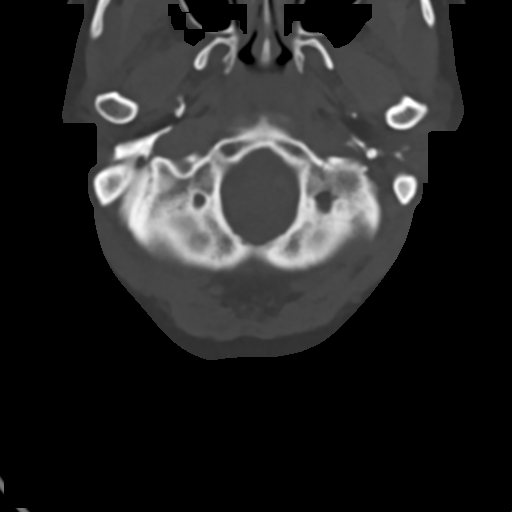

[Series 5: c_spine 2.0 sag bone · sagittal · 0.34mm/px · 5 of 74 slices shown, 6 images]
[im 25/74  bone]
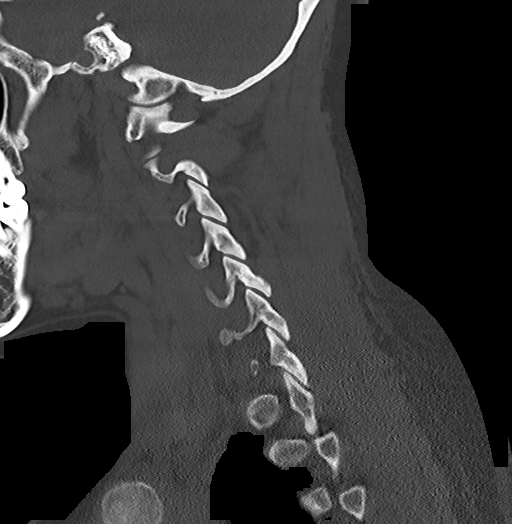
[im 31/74  bone]
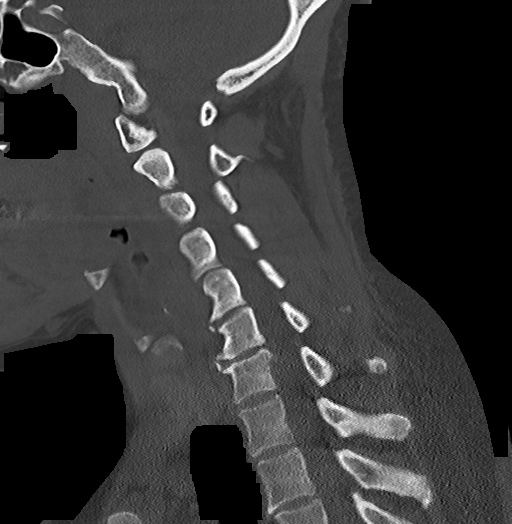
[im 37/74  soft-tissue]
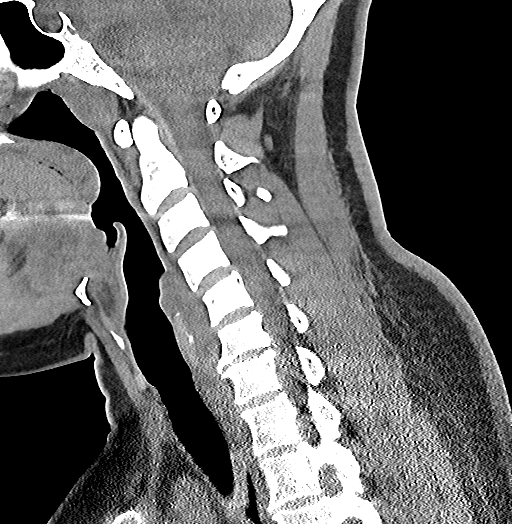
[im 37/74  bone]
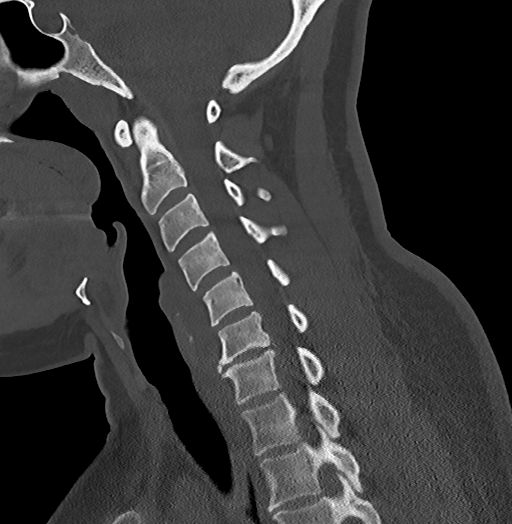
[im 43/74  bone]
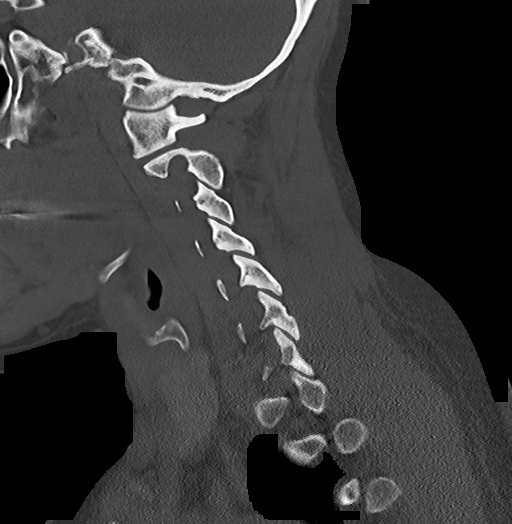
[im 49/74  bone]
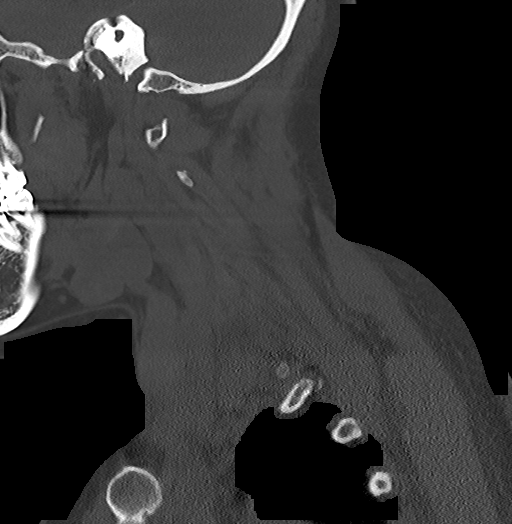

[Series 6: c_spine 2.0 cor bone · coronal · 0.33mm/px · 3 of 71 slices shown]
[im 15/71  bone]
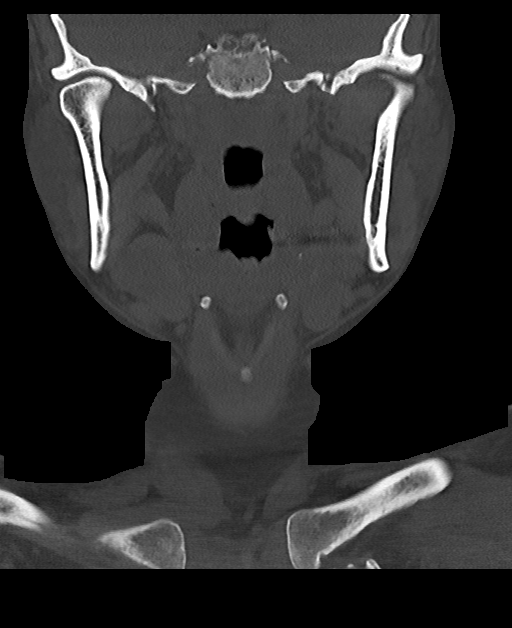
[im 29/71  bone]
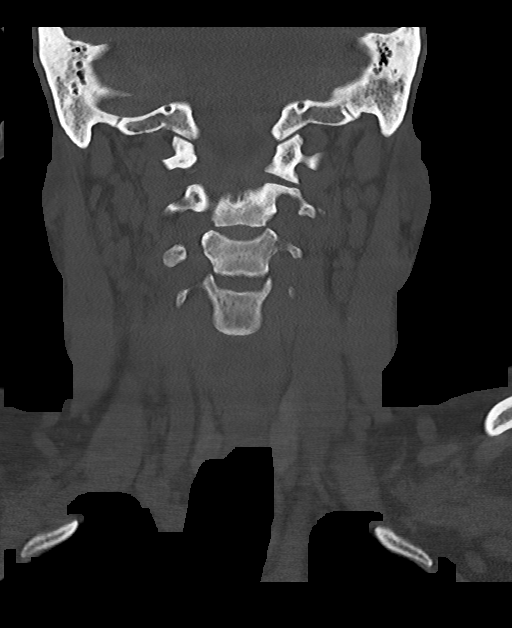
[im 43/71  bone]
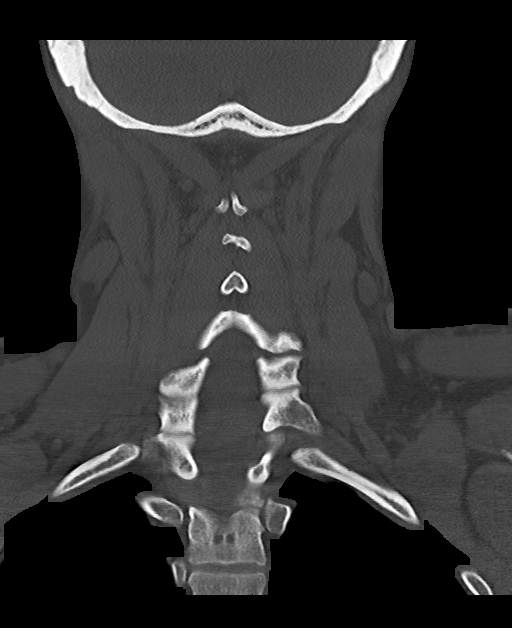

[13 of 33 positions shown; findings below may reference images not displayed]

FINDINGS: CT HEAD FINDINGS

Brain: Normal anatomic configuration. No abnormal intra or
extra-axial mass lesion or fluid collection. No abnormal mass effect
or midline shift. No evidence of acute intracranial hemorrhage or
infarct. Ventricular size is normal. Cerebellum unremarkable.

Vascular: Unremarkable

Skull: Intact

Sinuses/Orbits: Paranasal sinuses are clear. Orbits are
unremarkable.

Other: Mastoid air cells and middle ear cavities are clear.

CT CERVICAL SPINE FINDINGS

Alignment: Minimal cervical lordosis is likely positional in nature.
No listhesis.

Skull base and vertebrae: Craniocervical alignment is normal. The
atlantodental interval is not widened. No acute fracture of the
cervical spine. Vertebral body height has been preserved.

Soft tissues and spinal canal: No prevertebral fluid or swelling. No
visible canal hematoma. There is shotty bilateral submandibular,
anterior, and posterior jugulodigastric lymphadenopathy which may be
reactive in nature. These measure up to 10 mm in short axis diameter
within the submandibular lymph node groups bilaterally.

Disc levels: There is intervertebral disc space narrowing and
endplate remodeling at C6-7 and to a lesser extent C5-6 in keeping
with changes of mild to moderate degenerative disc disease.
Remaining intervertebral disc heights are preserved. Vertebral body
heights are preserved. The prevertebral soft tissues are not
thickened on sagittal reformats. Eccentric disc herniation at C5-6
likely narrows the left neural foramen and results in impingement of
the exiting C6 nerve root. Posterior disc osteophyte complex at C6-7
abuts and mildly flattens the thecal sac in keeping with at least
mild central canal stenosis. Review of the axial images demonstrates
no additional significant neuroforaminal narrowing.

Upper chest: Unremarkable

Other: None
IMPRESSION: No acute intracranial injury.  No calvarial fracture.

No acute fracture or listhesis of the cervical spine.

Eccentric left posterolateral disc herniation C5-6 with probable
impingement of the exiting C6 nerve root.

Mild central canal stenosis C6-7.

## 2024-08-05 ENCOUNTER — Other Ambulatory Visit: Payer: Self-pay

## 2024-08-05 ENCOUNTER — Emergency Department (HOSPITAL_BASED_OUTPATIENT_CLINIC_OR_DEPARTMENT_OTHER)
Admission: EM | Admit: 2024-08-05 | Discharge: 2024-08-05 | Disposition: A | Discharge status: Home / Self Care | Payer: Self-pay

## 2024-08-05 ENCOUNTER — Emergency Department (HOSPITAL_BASED_OUTPATIENT_CLINIC_OR_DEPARTMENT_OTHER): Payer: Self-pay | Admitting: Radiology

## 2024-08-05 DIAGNOSIS — M25511 Pain in right shoulder: Secondary | ICD-10-CM | POA: Insufficient documentation

## 2024-08-05 MED ORDER — LIDOCAINE 5 % EX PTCH
1.0000 | MEDICATED_PATCH | CUTANEOUS | Status: DC
  Administered 2024-08-05: 1 via TRANSDERMAL
  Filled 2024-08-05: qty 1

## 2024-08-05 MED ORDER — OXYCODONE-ACETAMINOPHEN 5-325 MG PO TABS
1.0000 | ORAL_TABLET | ORAL | Status: DC | PRN
  Administered 2024-08-05: 1 via ORAL
  Filled 2024-08-05: qty 1

## 2024-08-05 MED ORDER — CELECOXIB 200 MG PO CAPS: 200.0000 mg | ORAL_CAPSULE | Freq: Two times a day (BID) | ORAL | 0 refills | Status: AC

## 2024-08-05 NOTE — ED Provider Notes (Signed)
 " [facility] Provider Note   CSN: [id number] Arrival date & time: 08/05/24  1539     Patient presents with: Shoulder Pain (right)   Carla Bernard is a 49 y.o. female who presents emergency department chief complaint of shoulder pain.  There is a language.  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  Patient reports that she began having pain in her right shoulder 3 days ago.  She has never had any pain in her shoulder before.  She is left-hand dominant.  She was cleaning ice from her car 3 days ago with her right arm.  Patient reports she has had progressive worsening of pain to the point where she has cried out in agony and has been waking up from sleep if she rolls onto that side.  She denies numbness or tingling.  Pain radiates into her shoulder region.  She has no previous injuries to the side.  She takes 800 mg of ibuprofen  which gave her very little relief.    Shoulder Pain      Prior to Admission medications  Medication Sig Start Date End Date Taking? Authorizing Provider  ibuprofen  (ADVIL ) 800 MG tablet Take 1 tablet (800 mg total) by mouth 3 (three) times daily. Patient not taking: Reported on 04/12/2022 05/22/21   Mesner, Selinda, MD    Allergies: Patient has no known allergies.    Review of Systems  Updated Vital Signs BP (!) 143/84   Pulse 64   Temp 98.3 F (36.8 C) (Oral)   Resp 17   SpO2 100%   Physical Exam Vitals and nursing note reviewed.  Constitutional:      General: She is not in acute distress.    Appearance: She is well-developed. She is not diaphoretic.  HENT:     Head: Normocephalic and atraumatic.     Right Ear: External ear normal.     Left Ear: External ear normal.     Nose: Nose normal.     Mouth/Throat:     Mouth: Mucous membranes are moist.  Eyes:     General: No scleral icterus.    Conjunctiva/sclera: Conjunctivae normal.  Cardiovascular:     Rate and Rhythm: Normal rate and  regular rhythm.     Heart sounds: Normal heart sounds. No murmur heard.    No friction rub. No gallop.  Pulmonary:     Effort: Pulmonary effort is normal. No respiratory distress.     Breath sounds: Normal breath sounds.  Abdominal:     General: Bowel sounds are normal. There is no distension.     Palpations: Abdomen is soft. There is no mass.     Tenderness: There is no abdominal tenderness. There is no guarding.  Musculoskeletal:     Cervical back: Normal range of motion.     Comments: Tender over the humeral head and right trapezius.  Range of motion is limited due to severe pain with any movement.  The normal ipsilateral right elbow and wrist and hand examination equal grips bilaterally normal radial pulse 2+ with sensation intact and cap refill less than 2 seconds.  Skin:    General: Skin is warm and dry.  Neurological:     Mental Status: She is alert and oriented to person, place, and time.  Psychiatric:        Behavior: Behavior normal.     (all labs ordered are listed, but only abnormal results are displayed) Labs Reviewed - No data to display  EKG: None  Radiology: DG Shoulder Right Result Date: 08/05/2024 CLINICAL DATA:  Four day history of shoulder pain with abduction EXAM: RIGHT SHOULDER - 3 VIEW COMPARISON:  None Available. FINDINGS: There is no evidence of fracture or dislocation. Oval radiodensity projecting over the humeral neck on Raynald view is not seen on additional views. Soft tissues are unremarkable. IMPRESSION: 1. No acute fracture or dislocation. 2. Oval radiodensity projecting over the humeral neck on Grashey view is not seen on additional views and may be artifactual. Electronically Signed   By: Limin  Xu M.D.   On: 08/05/2024 16:37     Procedures   Medications Ordered in the ED  oxyCODONE -acetaminophen  (PERCOCET/ROXICET) 5-325 MG per tablet 1 tablet (1 tablet Oral Given 08/05/24 1605)                                    Medical Decision  Making Amount and/or Complexity of Data Reviewed Radiology: ordered.  Risk Prescription drug management.   Patient here with onset of right shoulder pain after removing ice from her car.  I visualized and interpreted right shoulder x-ray.  I can see the oval right density on the humeral neck per read by Dr. Jerri. I further discussed these findings with Dr.Liebkeman states that the most likely diagnosis calcific tendinitis or potential bony fragments in the shoulder.  Patient has no previous history of breast cancer or pathologic fracture.  I suspect this is likely due to either bursitis or tendinitis.  Patient will be discharged with anti-inflammatories rest ice sling for comfort and she will be given shoulder exercises.  Discussed follow-up, work note given appropriate for discharge at this time.     Final diagnoses:  None    ED Discharge Orders     None          Arloa Chroman, PA-C 08/05/24 1745    Simon Lavonia SAILOR, MD 08/05/24 2122  "

## 2024-08-05 NOTE — ED Triage Notes (Addendum)
 Right arm pain starting 4 days ago. Worsened yesterday. Cannot sleep. 10:10 pain. Starts in shoulder down entire arm. Numbness in hand. Sometimes radiates into back. Correlates, possibly, to chiseling ice off of car. Woke with neck pain this morning, remedied with 800mg  Ibuprofen .
